# Patient Record
Sex: Male | Born: 1975 | Race: White | Hispanic: No | Smoking: Current every day smoker
Health system: Southern US, Community
[De-identification: ages and names within clinical notes are randomized; demographics above are authoritative.]

## PROBLEM LIST (undated history)

## (undated) HISTORY — PX: HERNIA REPAIR: SHX51

## (undated) NOTE — Discharge Summary (Signed)
 Formatting of this note is different from the original. Images from the original note were not included.   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 Two-dimensional and 3-dimensional reformatted angiographic images were performed. CONTRAST: 100 mL iopamidoL (ISOVUE-370) 76 % injection 100 mL, no contrast reaction reported. FINDINGS: Bandlike opacities at the lung bases likely represent atelectasis.e Patent celiac artery. Redemonstrated intramural hematoma of the celiac artery extending into the proximal splenic artery and common hepatic artery. Small volume of associated hemorrhage not significant changed over the course of the splenic artery. Probable hepatic steatosis. The gallbladder is present. No hepatic, splenic, pancreatic, or adrenal gland lesion identified. 7 mm right renal superior pole cyst. No hydronephrosis. Otherwise unremarkable. The kidneys. Foci of subcutaneous air likely represents an injection site over the right lower quadrant. Visualized portions of the appendix are unremarkable. Unremarkable appearance of the bladder. Mild prostatomegaly. Trace free  fluid within the pelvis is abnormal. No acute process identified within the pelvis within the constraints of exam. No discrete adenopathy. Trace anterior vertebral body wedging is unchanged in the inferior thoracic spine. Moderate calcified and noncalcified plaque but nonaneurysmal abdominal aorta and its associated branches. Patent bilateral common iliac arteries, inferior mesenteric artery, superior mesenteric artery, bilateral renal arteries, and accessory left renal artery. Three-D reconstructed images support the findings above.   IMPRESSION: NO INTERVAL CHANGE IN INTRAMURAL HEMATOMA INVOLVING THE CELIAC TRUNK EXTENDING INTO THE SPLENIC ARTERY AND COMMON HEPATIC ARTERY. SMALL VOLUME OF ILL-DEFINED FAT STRANDING LIKELY REPRESENTS BLOOD PRODUCTS. VERY SMALL VOLUME OF SIMPLE FLUID NOTED IN THE PELVIS WITHOUT ETIOLOGY FOR THIS FINDING. Electronically Signed By: Lonni ONEIDA Rigg, MD 10/25/2022 15:59 Workstation: [id number]  Cat Scan Abdomen and Pelvis with IV Contrast Only  Result Date: 10/24/2022 CT SCAN OF THE ABDOMEN AND PELVIS WITH IV CONTRAST: COMPARISON: None Available at Time of Dictation TECHNIQUE: Utilizing automatic exposure control, helically acquired images were obtained from the lung bases through the symphysis pubis following IV contrast administration. CONTRAST: 100 mL iopamidoL (ISOVUE-370) 76 % injection 100 mL, no reaction. FINDINGS: Ill-defined high attenuation stranding surrounds the celiac trunk extending along the common hepatic and splenic arteries. Suggestion of underlying intramural hematoma. Findings asymmetric left with stranding/fluid extending caudally along the left retromesenteric plane. No artery luminal irregularity. No aneurysm. Mild luminal narrowing. Normal SMA and renal arteries. Hepatic steatosis, small cyst peripherally hepatic segment 4A. Normal gallbladder and biliary tree. Normal pancreas, spleen, adrenal glands, kidneys. Abdominal aorta normal caliber, mild scattered  atherosclerosis. No bowel obstruction or inflammatory changes. No free fluid. Normal bladder and prostate. Mild lumbar spondylosis, no significant canal stenosis. Mild hip osteoarthritis.    IMPRESSION: ACUTE INTRAMURAL HEMATOMA INVOLVING THE CELIAC TRUNK EXTENDING INTO THE SPLENIC AND COMMON  HEPATIC ARTERIES. DIFFERENTIAL CONSIDERATIONS INCLUDE SEGMENTAL ARTERIAL MEDIOLYSIS AND INFLAMMATORY ARTERITIDES.  THERE IS ILL-DEFINED STRANDING SURROUNDING THE INVOLVED ARTERIAL SEGMENTS INDICATIVE OF INFLAMMATORY CHANGES AND/OR BLEEDING. Electronically Signed By: Adine Morales MD 10/24/2022 21:07 Workstation: [id number]  Procedures:   None  PERTINENT LABS:  Recent Labs  Lab 10/28/22 9380 10/27/22 1037 10/27/22 0650 10/26/22 1015 10/26/22 0548 10/25/22 1609 10/25/22 0521 10/24/22 2001  WBC 12.3*   < >  --   --   --   --  12.9* 14.9*  HCT 49.6   < >  --   --  48.4   < > 49.8 51.8*  HGB 16.6   < >  --   --  16.1   < > 16.9 17.8*  PLT 233   < >  --   --   --   --  210 203  NA 141  --  144  --  141  --  143 139  K 4.5  --  3.6  --  4.0  --  4.0 5.1  CL 108*  --  109*  --  110*  --  112* 102  CO2 28.7  --  24.2  --  24.1  --  24.0 25.6  BUN 15  --  14  --  11  --  8* 9  CREATININE 1.00  --  0.89  --  0.91  --  0.86 0.87  CHOL  --   --   --   --  220*  --   --   --   TRIG  --   --   --   --  236*  --   --   --   HDL  --   --   --   --  39.5  --   --   --   LDL  --   --   --   --  133.3  --   --   --   HA1C  --   --   --  5.4  --   --   --   --   ALB  --   --   --   --   --   --  3.9 4.0  TP  --   --   --   --   --   --  7.1 7.9  DBIL  --   --   --   --   --   --  0.2 <0.1  ALT  --   --   --   --   --   --  32 45  AST  --   --   --   --   --   --  19 40*  ALK  --   --   --   --   --   --  105 124*   < > = values in this interval not displayed.    BRIEF ADMISSION HISTORY:  Patient is a 1 y.o. male with no known PMH who presents from Home with abdominal pain. Pt reports having recent cough/upper  resp congestion for the last few weeks. He and partner have had dry cough for >1 week but otherwise the patient has felt well. Yesterday AM he woke up with severe epigastric pain. This was assoc w/ nausea but no vomiting or diarrhea. BMs have been normal. No blood in stool. He was found to be severely hypertensive on arrival to the ER  w/ BP 220/110s. He denies prior h/o HTN but doesn't see a doctor routinely. Despite improvement in BP he continues to have severe epigastric pain.  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 ? 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 Presumed 2/2 hypertensive emergency w/ dissection ? ESR, CRP, hepatitis panel, HIV, ANA, ANCA normal ? Was treated w/ 3 days of solumedrol 1 mg/kg daily until vasculitis was ruled out with negative auto-antibody testing ? BP control as below ? Vascular Surgery was consulted but no surgical intervention was recommended - discussed w/ Dr. Dovie at the time of discharge, will need repeat imaging as outpatient  Hypertensive emergency. ? Coreg and hydralazine were started with good effect ? Short term goal SBP <160 in the setting of dissection  Hyperlipidemia. ? Statin was started  URI 2/2 rhinovirus. ? Supportive care  DISCHARGE EXAM: Vitals:   10/28/22 0536 10/28/22 0606 10/28/22 0800 10/28/22 0854  BP: (!) 141/96  (!) 140/95   BP Location: Left upper arm     Patient Position: Semi- fowlers     Pulse: 53   54  Resp: 16   16  Temp: 97.9 F (36.6 C)   98.1 F (36.7 C)  TempSrc: Oral   Oral  SpO2: 96%   96%  Weight:  216 lb 5 oz (98.1 kg)    Height:       Weight on admission Weight: 165 lb (74.8 kg) Body mass index is 30.17 kg/m. Weight on discharge Weight: 216 lb 5 oz (98.1 kg) Body mass index is 30.17 kg/m. General:  Alert and oriented to person, place, and time. No acute distress. HEENT:  PERRLA. No conjunctival erythema. No  icterus.  Moist mucous membranes. Respiratory: Clear to auscultation bilaterally. Cardiovascular:  Regular rate and rhythm. Abdomen:  Normal bowel sounds. Abdomen soft, epigastric and LUQ tenderness, and non-distended. Extremities:  No cyanosis or edema. Neurologic: Cranial nerves II-XII intact. Sensory and motor grossly intact.  Disposition:        home   DISCHARGE MEDS, DIET, WOUND CARE:   Discharge medications Discharge meds  Current Discharge Medication List   START taking these medications     Dose Details  atorvastatin 40 MG tablet Commonly known as: LIPITOR Signed by: Tinnie Blush, MD   40 mg  Take 1 tablet (40 mg total) by mouth nightly. Quantity: 90 tablet Refills: 0  carvediloL 12.5 MG tablet Commonly known as: COREG Signed by: Tinnie Blush, MD   12.5 mg  Take 1 tablet (12.5 mg total) by mouth 2 (two) times daily with meals. Quantity: 180 tablet Refills: 0  hydrALAZINE 50 MG tablet Commonly known as: APRESOLINE Signed by: Tinnie Blush, MD   50 mg  Take 1 tablet (50 mg total) by mouth every 8 (eight) hours. Quantity: 270 tablet Refills: 0  oxyCODONE  10 mg immediate release tablet Signed by: Tinnie Blush, MD   10 mg  Take 1 tablet (10 mg total) by mouth every 6 (six) hours as needed for Pain. Quantity: 30 tablet Refills: 0    Where to Get Your Medications   These medications were sent to [facility] - [place], [place] - [address]  [address] [place], [place] [address]   Hours: M-F: 8:00AM-7:00PM; Sat-Sun: 9:00AM-5:00PM Phone: [phone]   atorvastatin 40 MG tablet  carvediloL 12.5 MG tablet  hydrALAZINE 50 MG tablet  oxyCODONE  10 mg immediate release tablet   Diet Continue  inpatient diet:  Diet and Nourishment Orders     Start       10/27/22 9178  Diet  Diet effective now       Question:  Diet  Answer:  Regular       Wound Wound:  Wound Orders (From admission, onward)   None    DISCHARGE PLAN OF  CARE: 1. Follow up: PCP: referred to [facility] Referred to [facility] Transitions of Care Provider: N/A Specialist: Vascular Surgery in 2-4 weeks Scheduled Appointments: No future appointments.   2. Labs needed after discharge: None  3. Pending labs at the time of discharge: None  4. Clinical issues to be addressed at follow-up: - Needs to establish w/ PCP for long term BP management  - F/u with Vascular Surgery for repeat imaging   45 minutes spent on discharge planning and medication reconciliation (>50% of time spent on counseling).  Tinnie Blush, MD 10/28/2022 13:05 [facility]  Electronically signed by Tinnie Blush, MD at 10/28/2022  1:42 PM EDT

## (undated) NOTE — Progress Notes (Signed)
 Formatting of this note is different from the original. Vascular Surgery Daily Progress Note HD:  LOS: 1 day  Subjective: Reports very slight improvement in LUQ pain from yesterday. No longer feeling nauseated, denies emesis since arrival to the hospital yesterday. BP has been at goal of < 160  Objective: Temp (24hrs), Avg:97.6 F (36.4 C), Min:97.6 F (36.4 C), Max:97.7 F (36.5 C)  Temp:  [97.6 F (36.4 C)-97.7 F (36.5 C)] 97.7 F (36.5 C) Heart Rate:  [47-94] 63 Resp:  [14-16] 16 BP: (106-226)/(67-126) 143/90 SpO2:  [91 %-98 %] 97 %   Intake/Output: No intake or output data in the 24 hours ending 10/26/22 1003  UOP: NR  Physical Exam: BP (!) 143/90 (Patient Position: Semi- fowlers)   Pulse 63   Temp 97.7 F (36.5 C) (Oral)   Resp 16   Ht 5' 11 (1.803 m)   Wt 165 lb (74.8 kg)   SpO2 97%   BMI 23.01 kg/m  Gen: no acute distress Neuro: A&O x 3 Chest/Cards: regular rate Pulm: normal effort Abd: soft, moderate LUQ tenderness w/o rebound or peritonitis, remaining abdomen non-tender, nondistended Exts: warm, well perfused Pulses: b/l palpable radial/femoral/DP pulses  Labs/Studies: Recent Labs  Lab 10/26/22 0548 10/25/22 1609 10/25/22 0521 10/24/22 2001  WBC  --   --  12.9* 14.9*  HGB 16.1 17.0 16.9 17.8*  HCT 48.4 49.5 49.8 51.8*  PLT  --   --  210 203   Recent Labs  Lab 10/26/22 0548 10/25/22 0521 10/24/22 2001  NA 141 143 139  K 4.0 4.0 5.1  CL 110* 112* 102  CO2 24.1 24.0 25.6  BUN 11 8* 9  CREATININE 0.91 0.86 0.87  MG  --  1.9  --    Recent Labs  Lab 10/25/22 0521 10/24/22 2001  TBIL 0.7 0.8  DBIL 0.2 <0.1  AST 19 40*  ALT 32 45  ALK 105 124*  ALB 3.9 4.0  TP 7.1 7.9   Micro:  Influenza/Covid/RSV: neg Resp Panel (4/16): rhinovirus/enterovirus  Imaging:  CT ANGIOGRAM ABDOMEN PELVIS W WO CONTRAST Narrative: CT ANGIOGRAPHY OF THE ABDOMEN AND PELVIS  Reason for exam: f/u scan for intramural hematoma of celiac artery extending  into splenic and hepatic identifed on CTAP at OSH  COMPARISON: 10/24/2022  TECHNIQUE: Utilizing automatic exposure control, helically acquired images were obtained from the lung bases through the symphysis pubis with and without IV contrast administration.  Two-dimensional and 3-dimensional reformatted angiographic images were performed.  CONTRAST: 100 mL iopamidoL (ISOVUE-370) 76 % injection 100 mL, no contrast reaction reported.   FINDINGS: Bandlike opacities at the lung bases likely represent atelectasis.e  Patent celiac artery. Redemonstrated intramural hematoma of the celiac artery extending into the proximal splenic artery and common hepatic artery. Small volume of associated hemorrhage not significant changed over the course of the splenic artery. Probable hepatic steatosis. The gallbladder is present. No hepatic, splenic, pancreatic, or adrenal gland lesion identified. 7 mm right renal superior pole cyst. No hydronephrosis. Otherwise unremarkable. The kidneys. Foci of subcutaneous air likely represents an injection site over the right lower quadrant. Visualized portions of the appendix are unremarkable. Unremarkable appearance of the bladder. Mild prostatomegaly. Trace free fluid within the pelvis is abnormal. No acute process identified within the pelvis within the constraints of exam. No discrete adenopathy. Trace anterior vertebral body wedging is unchanged in the inferior thoracic spine.  Moderate calcified and noncalcified plaque but nonaneurysmal abdominal aorta and its associated branches. Patent bilateral common iliac arteries,  inferior mesenteric artery, superior mesenteric artery, bilateral renal arteries, and accessory left renal artery.  Three-D reconstructed images support the findings above. Impression: IMPRESSION:  NO INTERVAL CHANGE IN INTRAMURAL HEMATOMA INVOLVING THE CELIAC TRUNK EXTENDING INTO THE SPLENIC ARTERY AND COMMON HEPATIC ARTERY. SMALL  VOLUME OF ILL-DEFINED FAT STRANDING LIKELY REPRESENTS BLOOD PRODUCTS.  VERY SMALL VOLUME OF SIMPLE FLUID NOTED IN THE PELVIS WITHOUT ETIOLOGY FOR THIS FINDING.  Electronically Signed By: Lonni ONEIDA Rigg, MD 10/25/2022 15:59 Workstation: [id number]  Assessment: Leonard Gilbert is a 61 y.o. Caucasian male with pmhx significant only for anxiety and GERD who presented to [facility] with acute LUQ pain, CT scan revealed an intramural hematoma of the celiac trunk extending into the splenic and common hepatic arteries so he was transferred to our facility for further work-up/management, Vascular Surgery has been consulted in order to evaluate if surgical intervention is indicated. Patient denies history of autoimmune disorder or vasculitis.  Repeat CTA scan reviewed, hard to decipher if the stranding noted around the arterial segments is inflammatory changes or blood, however, imaging findings are stable from the initial CT scan that was completed >12 hrs prior, patient remains hemodynamically stable w/ a benign abdomen, and HgB is stable.  Plan: No surgical intervention at this time, CTM abdominal exam AM HgB ordered for 4/18 Goal SBP < 160 ANA/HIV/Hepatitis panel is negative, CRP very slightly elevated at 3.0, ANCA pending. Decision regarding steroids per primary team Diet: clears, ADAT to regular. mIVF d/c'd  Kate Earnie Mash, MD PGY-4 10/26/2022 10:03 Contact through [employer]  ATTENDING ATTESTATION  I saw and evaluated the patient, performing the key elements of the service. I discussed the findings, assessment and plan with the resident, and agree with the resident's exam findings and proposed management and treatment plans. My countersignature will demonstrate my concurrence with that documentation. Pt seen 10-26-22 Abdominal and back pain improved Abdomen soft - no significant tenderness Imaging c/w dissection celiac artery and branches Pt with poorly controlled HTN upon admission No  vascular surgical / interventional emergency at present Recommend medical management with pulse pressure control Plan repeat imaging pending Pt and family updated and consent/agreee  Alm Rife, MD [facility]    Electronically signed by Kate Earnie Mash, MD at 10/26/2022 11:55 AM EDT Electronically signed by Kate Earnie Mash, MD at 10/26/2022 11:55 AM EDT Electronically signed by Kate Earnie Mash, MD at 10/26/2022 11:57 AM EDT Electronically signed by Alm DELENA Rife, MD at 10/28/2022  8:46 AM EDT Electronically signed by Alm DELENA Rife, MD at 10/28/2022  8:46 AM EDT

## (undated) NOTE — Progress Notes (Signed)
 Formatting of this note is different from the original. Vascular Surgery Daily Progress Note HD:  LOS: 3 days  Subjective: Says that he ate a little bit of regular food yesterday, had some abdominal discomfort afterwards but overall says his abdominal pain continues to improve slowly each day. BP has still been intermittently > 160 despite primary team starting a 2nd scheduled anti-HTN yesterday  Objective: Temp (24hrs), Avg:98 F (36.7 C), Min:97.9 F (36.6 C), Max:98.1 F (36.7 C)  Temp:  [97.9 F (36.6 C)-98.1 F (36.7 C)] 98.1 F (36.7 C) Heart Rate:  [52-83] 54 Resp:  [16] 16 BP: (140-192)/(92-108) 140/95 SpO2:  [96 %] 96 %   Intake/Output:  Intake/Output Summary (Last 24 hours) at 10/28/2022 1111 Last data filed at 10/28/2022 0854 Gross per 24 hour  Intake 120 ml  Output --  Net 120 ml   UOP: NR  Physical Exam: BP (!) 140/95   Pulse 54   Temp 98.1 F (36.7 C) (Oral)   Resp 16   Ht 5' 11 (1.803 m)   Wt 216 lb 5 oz (98.1 kg)   SpO2 96%   BMI 30.17 kg/m  Gen: no acute distress Neuro: A&O x 3 Chest/Cards: regular rate Pulm: normal effort Abd: soft, mild LUQ tenderness w/o rebound or peritonitis (improved from yesterday), remaining abdomen non-tender, nondistended Exts: warm, well perfused Pulses: b/l palpable radial/femoral/DP pulses  Labs/Studies: Recent Labs  Lab 10/28/22 0619 10/27/22 1037 10/26/22 0548 10/25/22 1609 10/25/22 0521  WBC 12.3* 13.5*  --   --  12.9*  HGB 16.6 16.2 16.1   < > 16.9  HCT 49.6 47.4 48.4   < > 49.8  PLT 233 212  --   --  210   < > = values in this interval not displayed.   Recent Labs  Lab 10/28/22 0619 10/27/22 0650 10/26/22 0548 10/25/22 0521  NA 141 144 141 143  K 4.5 3.6 4.0 4.0  CL 108* 109* 110* 112*  CO2 28.7 24.2 24.1 24.0  BUN 15 14 11  8*  CREATININE 1.00 0.89 0.91 0.86  MG  --   --   --  1.9   Recent Labs  Lab 10/25/22 0521 10/24/22 2001  TBIL 0.7 0.8  DBIL 0.2 <0.1  AST 19 40*  ALT 32 45   ALK 105 124*  ALB 3.9 4.0  TP 7.1 7.9   Micro:  Influenza/Covid/RSV: neg Resp Panel (4/16): rhinovirus/enterovirus  Imaging:  No new  Assessment: Leonard Gilbert is a 29 y.o. Caucasian male with pmhx significant only for anxiety and GERD who presented to [facility] with acute LUQ pain, CT scan revealed a dissection of the celiac trunk extending into the splenic and common hepatic arteries so he was transferred to our facility for further work-up/management, Vascular Surgery has been consulted in order to evaluate if surgical intervention is indicated. Patient denies history of autoimmune disorder or vasculitis.  Repeat CTA scan reviewed, hard to decipher if the stranding noted around the arterial segments is inflammatory changes or blood, however, imaging findings are stable from the initial CT scan that was completed >12 hrs prior, patient remains hemodynamically stable w/ a benign abdomen, and HgB is stable.  Plan: STRICT BP CONTROL w/ goal SBP < 160, patient will need to be on an adequate PO regimen to achieve this goal prior to discharge home. Was started on Coreg 12.5 TID on 4/16, primary team increased the dosage of scheduled hydralazine today. Recommend adding a beta-blocker if BP is still  uncontrolled on this regimen HgB remains stable, no need to further trend ANA/ANCA/HIV/Hepatitis panel is negative, ESR/CRP normal. Do not believe that patient need steroids, will defer final decision to primary Regular diet as tolerated No surgical intervention indicated at this time  Kate Earnie Mash, MD PGY-4 10/28/2022 11:11 Contact through [facility]  ATTENDING ATTESTATION  I saw and evaluated the patient, performing the key elements of the service. I discussed the findings, assessment and plan with the resident, and agree with the resident's exam findings and proposed management and treatment plans. My countersignature will demonstrate my concurrence with that documentation. 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 - no change of f/u imaging Pt with poorly controlled HTN upon admission - being managed by Medical team No vascular surgical / interventional emergency at present Recommend medical management with pulse pressure control - as ongoing Pt and family updated and consent/agree Will see in f/u after d/c  Alm Rife, MD [facility]    Electronically signed by Kate Earnie Mash, MD at 10/28/2022 11:18 AM EDT Electronically signed by Alm DELENA Rife, MD at 10/29/2022 10:02 AM EDT Electronically signed by Alm DELENA Rife, MD at 10/29/2022 10:02 AM EDT

## (undated) NOTE — Progress Notes (Signed)
 Formatting of this note is different from the original. Vascular Surgery Daily Progress Note HD:  LOS: 2 days  Subjective: Tolerating clear liquids w/o nausea or emesis, feels like trying something more substantial to eat this morning. Reports further improvement in LUQ abdominal pain from yesterday. BP has been intermittently elevated >160 systolic over the past 24 hrs, was as high as 198 systolic at midnight check.  Objective: Temp (24hrs), Avg:98 F (36.7 C), Min:97.9 F (36.6 C), Max:98.1 F (36.7 C)  Temp:  [97.9 F (36.6 C)-98.1 F (36.7 C)] 98 F (36.7 C) Heart Rate:  [55-75] 75 Resp:  [16] 16 BP: (120-198)/(86-111) 155/101 SpO2:  [94 %-97 %] 94 %   Intake/Output: No intake or output data in the 24 hours ending 10/27/22 0840  UOP: NR  Physical Exam: BP (!) 155/101   Pulse 75   Temp 98 F (36.7 C) (Oral)   Resp 16   Ht 5' 11 (1.803 m)   Wt 165 lb (74.8 kg)   SpO2 94%   BMI 23.01 kg/m  Gen: no acute distress Neuro: A&O x 3 Chest/Cards: regular rate Pulm: normal effort Abd: soft, mild LUQ tenderness w/o rebound or peritonitis, remaining abdomen non-tender, nondistended Exts: warm, well perfused Pulses: b/l palpable radial/femoral/DP pulses  Labs/Studies: Recent Labs  Lab 10/26/22 0548 10/25/22 1609 10/25/22 0521 10/24/22 2001  WBC  --   --  12.9* 14.9*  HGB 16.1 17.0 16.9 17.8*  HCT 48.4 49.5 49.8 51.8*  PLT  --   --  210 203   Recent Labs  Lab 10/27/22 0650 10/26/22 0548 10/25/22 0521  NA 144 141 143  K 3.6 4.0 4.0  CL 109* 110* 112*  CO2 24.2 24.1 24.0  BUN 14 11 8*  CREATININE 0.89 0.91 0.86  MG  --   --  1.9   Recent Labs  Lab 10/25/22 0521 10/24/22 2001  TBIL 0.7 0.8  DBIL 0.2 <0.1  AST 19 40*  ALT 32 45  ALK 105 124*  ALB 3.9 4.0  TP 7.1 7.9   Micro:  Influenza/Covid/RSV: neg Resp Panel (4/16): rhinovirus/enterovirus  Imaging:  No new  Assessment: Leonard Gilbert is a 17 y.o. Caucasian male with pmhx significant only  for anxiety and GERD who presented to [facility] with acute LUQ pain, CT scan revealed an intramural hematoma of the celiac trunk extending into the splenic and common hepatic arteries so he was transferred to our facility for further work-up/management, Vascular Surgery has been consulted in order to evaluate if surgical intervention is indicated. Patient denies history of autoimmune disorder or vasculitis.  Repeat CTA scan reviewed, hard to decipher if the stranding noted around the arterial segments is inflammatory changes or blood, however, imaging findings are stable from the initial CT scan that was completed >12 hrs prior, patient remains hemodynamically stable w/ a benign abdomen, and HgB is stable.  Plan: STRICT BP CONTROL w/ goal SBP < 160, patient will need to be on an adequate PO regimen to achieve this goal prior to discharge home. Was started on Coreg 12.5 TID on 4/16, primary team added PO hydralazine today F/u AM HgB (pending ANA/HIV/Hepatitis panel is negative, ESR normal, CRP trending down, ANCA pending. Decision regarding steroids per primary team, currently on Solumedrol Diet advanced to regular No surgical intervention indicated at this time  Kate Earnie Mash, MD PGY-4 10/27/2022 08:40 Contact through [facility]  ATTENDING ATTESTATION  I saw and evaluated the patient, performing the key elements of the service. I  discussed the findings, assessment and plan with the resident, and agree with the resident's exam findings and proposed management and treatment plans. My countersignature will demonstrate my concurrence with that documentation. 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

## (undated) NOTE — Unmapped External Note (Signed)
 Formatting of this note is different from the original. Images from the original note were not included.  [facility] HOSPITALIST ACCEPTANCE NOTE  Facility/Office requesting transfer/admission:   [facility]  Date/Time of accept: 10/24/2022 22:51  Transferring provider:   Wanda Servant, NP  PCP:  No primary care provider on file.   Reported presenting complaints: Chief Complaint  Patient presents with  ? Abdominal Pain    Pt reports LUQ pain that started at 0700 this morning, + nausea and diarrhea. Fever of 101 this morning.    Reported physical exam findings:  BP 196/134 (Patient Position: Sitting)   Pulse 59   Temp 98.1 F (36.7 C) (Oral)   Resp 15   Ht 5' 11 (1.803 m)   Wt 165 lb (74.8 kg)   SpO2 96%   BMI 23.01 kg/m   Notable radiology or laboratory findings:   H/H 17.8 / 51.8  WBC 14.9  Treatment given prior to arrival to [facility]:   See Below   Diagnostics / Imaging: Cat Scan Abdomen and Pelvis with IV Contrast Only  Result Date: 10/24/2022 CT SCAN OF THE ABDOMEN AND PELVIS WITH IV CONTRAST: COMPARISON: None Available at Time of Dictation TECHNIQUE: Utilizing automatic exposure control, helically acquired images were obtained from the lung bases through the symphysis pubis following IV contrast administration. CONTRAST: 100 mL iopamidoL (ISOVUE-370) 76 % injection 100 mL, no reaction. FINDINGS: Ill-defined high attenuation stranding surrounds the celiac trunk extending along the common hepatic and splenic arteries. Suggestion of underlying intramural hematoma. Findings asymmetric left with stranding/fluid extending caudally along the left retromesenteric plane. No artery luminal irregularity. No aneurysm. Mild luminal narrowing. Normal SMA and renal arteries. Hepatic steatosis, small cyst peripherally hepatic segment 4A. Normal gallbladder and biliary tree. Normal pancreas, spleen, adrenal glands, kidneys. Abdominal aorta normal caliber, mild scattered atherosclerosis. No bowel  obstruction or inflammatory changes. No free fluid. Normal bladder and prostate. Mild lumbar spondylosis, no significant canal stenosis. Mild hip osteoarthritis.    IMPRESSION:  - ACUTE INTRAMURAL HEMATOMA INVOLVING THE CELIAC TRUNK EXTENDING INTO THE SPLENIC AND COMMON HEPATIC ARTERIES.  - DIFFERENTIAL CONSIDERATIONS INCLUDE SEGMENTAL ARTERIAL MEDIOLYSIS AND INFLAMMATORY ARTERITIDES.  -  THERE IS ILL-DEFINED STRANDING SURROUNDING THE INVOLVED ARTERIAL SEGMENTS INDICATIVE OF INFLAMMATORY CHANGES AND/OR BLEEDING.  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 Hypertensive emergency ? Transfer for vascular, GI consults ? Presenting symptom was LUQ pain ? Hypertensive emergency on arrival  Date/Time Temp Pulse Resp BP ABP Arterial Line BP  SpO2 Weight Who  10/24/22 2240 -- -- -- 196/134 -- -- -- MA  10/24/22 2204 -- 59 15 181/120 -- 96 % -- MA  10/24/22 2132 -- 84 16 226/130 -- 98 % -- CB  10/24/22 1908 98.1 F (36.7 C) 60 16 221/120 -- 97 % 165 lb (74.8 kg)    Anticipated needs once transfer complete: ? Imaging: MRI Abdomen ? ? Procedure: Per GI / Vascualr ? Speciality Consultation: GI, Vascular  Plan:  Level of bed request: Floor  Acuity Level: ? 3  Leonard Ozell Peach, DO  10/24/2022 22:51 [employer]  (Nocturnist)  Electronically signed by Leonard CHRISTELLA Peach, DO at 10/24/2022 10:55 PM EDT

## (undated) NOTE — Unmapped External Note (Signed)
 Formatting of this note might be different from the original. Vascular Surgery Plan of Care:  CTA images reviewed, no urgent or emergent surgical intervention indicated. CTM abdominal exam and continue to trend HgB q8hr. SBP has been at goal of < 160  Kate Earnie Mash, MD PGY-4 10/25/2022 18:39 Contact through [facility]  ATTENDING ATTESTATION  I saw and evaluated the patient, performing the key elements of the service. I discussed the findings, assessment and plan with the resident, and agree with the resident's exam findings and proposed management and treatment plans. My countersignature will demonstrate my concurrence with that documentation.  Alm Rife, MD [facility]    Electronically signed by Kate Earnie Mash, MD at 10/25/2022  6:39 PM EDT Electronically signed by Alm DELENA Rife, MD at 10/28/2022  8:44 AM EDT Electronically signed by Alm DELENA Rife, MD at 10/28/2022  8:44 AM EDT

## (undated) NOTE — Unmapped External Note (Signed)
 Formatting of this note might be different from the original.  Problem: Oral Intake Aspiration - Risk of Goal: Absence of aspiration Outcome: Progressing  Problem: Tobacco Use Goal: Inpatient tobacco use cessation counseling participation Description: REMINDER(s): If patient refuses counseling, please document the refusal. Outcome: Progressing  Problem: Droplet Isolation Precautions Goal: Absence of active infection Outcome: Progressing  Problem: NPO/Enteral Aspiration - Risk of Goal: Absence of aspiration Outcome: Completed Goal: Patent airway Outcome: Completed  Electronically signed by Veria Earnie Penner, RN at 10/28/2022 11:06 AM EDT

## (undated) NOTE — Progress Notes (Signed)
 Formatting of this note is different from the original. Images from the original note were not included.  [facility] Hospitalist Progress Note Date of Service: 10/26/2022  Patient Name:  Leonard Gilbert    DOB:  04-01-1976    MEDICAL RECORD [id number] Date of Admission:  10/24/2022    PCP: No primary care provider on file.  Subjective:  Pain better w/ morphine but worried about what will happen when pain meds wear off. Appetite still poor, has only tried a couple of sips of liquids. No vomiting or diarrhea.  Family Communication  I discussed the patients clinical course and treatment plan with the patient who was awake and alert, the patient's family/designee at the bedside  Physical Exam: BP (!) 155/94 (Patient Position: Semi- fowlers)   Pulse 50   Temp 97.6 F (36.4 C) (Oral)   Resp 14   Ht 5' 11 (1.803 m)   Wt 165 lb (74.8 kg)   SpO2 98%   BMI 23.01 kg/m  Temp (24hrs), Avg:97.6 F (36.4 C), Min:97.6 F (36.4 C), Max:97.6 F (36.4 C)  Weight on admission Weight: 165 lb (74.8 kg) Weight today Weight: 165 lb (74.8 kg)  General:  Alert and oriented to person, place, and time. No acute distress. HEENT:  PERRLA. No conjunctival erythema. No icterus.  Moist mucous membranes. Respiratory: Clear to auscultation bilaterally. Cardiovascular:  Regular rate and rhythm. Abdomen:  Normal bowel sounds. Abdomen soft, epigastric and LUQ tenderness, and non-distended. Extremities:  No cyanosis or edema. Neurologic: Cranial nerves II-XII intact. Sensory and motor grossly intact.  Current Medications: ? carvediloL  12.5 mg Oral BID With Food  ? [Held by provider] enoxaparin (LOVENOX) injection  40 mg Subcutaneous Daily  ? methylPREDNISolone  (SOLU-MEDROL ) IV  1 mg/kg Intravenous Q24H  ? nicotine  1 patch Transdermal Daily  ? pantoprazole (PROTONIX) IV  40 mg Intravenous Q12H   Labs:   Recent Labs  Lab 10/24/22 2001 10/25/22 0521  NA 139 143  K 5.1 4.0  CL 102 112*  CO2 25.6 24.0  BUN 9 8*   CREATININE 0.87 0.86  GLUC 114* 120*   Recent Labs  Lab 10/24/22 2001 10/25/22 0521 10/25/22 1609  WBC 14.9* 12.9*  --   HGB 17.8* 16.9 17.0  HCT 51.8* 49.8 49.5  PLT 203 210  --    Recent Labs  Lab 10/24/22 2001 10/25/22 0521  TBIL 0.8 0.7  DBIL <0.1 0.2  AST 40* 19  ALT 45 32  ALK 124* 105  ALB 4.0 3.9  TP 7.9 7.1   ASSESSMENT & PLAN  Abdominal pain 2/2 intramural hematoma of celiac artery. ? 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 Possibly secondary to vasculitis vs hypertensive emergency w/ dissection ? ESR, CRP, hepatitis panel, HIV, ANA normal ? ANCA pending ? Continue coreg, goal SBP <140 ? Continue solumedrol 1 mg/kg daily (at least until vasculitis is ruled out) ? Appreciate input of Vascular Surgery   Hypertensive emergency. ? Continue coreg, and PRN labetalol or hydralazine   URI 2/2 rhinovirus. ? Supportive care  Goals for today: IV steroids, BP control, await ANCA Disposition: Home Expected date of discharge: 2-3 days  DVT Prophylaxis: SCD's Fluids: normal saline Nutrition: Other clear liquid Code Status: full code  MDM: PROBLEM COMPLEXITY - celiac artery dissection - high DATA COMPLEXITY - (moderate 1/3, high 2/3) Category 1 (3 of 4) ? Reviewed results of tests/studies CBC BMP, HIV, hepatitis, ANA Category 2 ? None Category  3 ? None RISKS - High risk of organ failure/deterioration or death despite max therapy - high  INPATIENT ROUNDING LEVEL 3 - [id number]  Tinnie Blush, MD  10/26/2022 05:51 [facility] [place]  Electronically signed by Tinnie Blush, MD at 10/26/2022  4:23 PM EDT

## (undated) NOTE — Progress Notes (Signed)
 Formatting of this note might be different from the original. Patient discharge home with significant other. All IV's removed. Discharged instructions reviewed with patient at bedside and he verbalized understanding. Patient taken to outpatient pharmacy upon discharge.  Electronically signed by Tawni Lucie Chow, RN at 10/28/2022  3:01 PM EDT

## (undated) NOTE — Progress Notes (Signed)
 Formatting of this note is different from the original.   10/25/22 0938  Discharge Assessment  Discharge Screening No identified indicators  Case Management Face to Face Assessment (FOR CASE MANAGEMENT STAFF ONLY) Recommended  Support Systems Spouse/significant other  Lives With Significant other  Type of Residence Private residence  Home Layout One level  Home Access Stairs to enter with rails  Baseline Walking Independent  Baseline Wheelchair mobility Not applicable  Baseline Cognition Intact  Baseline Mode of Retail Banker None  Home Bathroom Equipment None  Services Prior to Admission None  Patient Discharge Goal/Expectation Home-patient/family capable  Case Management Face to Face Assessment Status (FOR CASE MANAGEMENT STAFF ONLY) Complete  Case Management Financial Assessment Uninsured  Pharmacy Transitions of Care - Charted by SW/CM only  Fill Medications on Discharge at Outpatient Pharmacy? Yes, pending discharge medication orders  Does Patient Have Medication Access Issues? Yes,unsure or no prescription insurance  To Do List For DC Tech  Routine  To Do List For Social Work Medication Assistance  Complete  Social Work Completed Med Programmer, Mult[employer] 1  To Do List For Outpatient Pharmacy Routine  Transition Role SW/CM   Case Management has assessed this patient/family or caregiver's readiness, willingness and ability to provide or support self-management activities when needed after discharge from the acute care setting.  CM met with patient at bedside.  He is admitted with vasculitis.  He is independent and drives.  He does not have a PCP or insurance.  He does not take any medications.  He works about 30 hours a week.  CM entered for pharmacy to assist with medications.  CM will place [facility] on AVS for outpatient follow up.  No anticipated discharge needs at this time.  CM will continue to follow.  Randall Boom RN Case Manager  [phone]  Electronically signed by Randall Massie Boom, Case Manager at 10/25/2022  9:44 AM EDT

## (undated) NOTE — Progress Notes (Signed)
 Formatting of this note is different from the original. Images from the original note were not included.  [facility] Hospitalist Progress Note Date of Service: 10/27/2022  Patient Name:  Leonard Gilbert    DOB:  [birth year]    MEDICAL RECORD [id number] Date of Admission:  10/24/2022    PCP: No primary care provider on file.  Subjective:  Pain overall better but still requiring intermittent morphine. BP very high (190/110s) during pain episodes. Still hasn't been able to eat any solid food.  Family Communication  I discussed the patients clinical course and treatment plan with the patient who was awake and alert, the patient's family/designee at the bedside  Physical Exam: BP (!) 120/90 (Patient Position: Semi- fowlers)   Pulse 74   Temp 98 F (36.7 C) (Oral)   Resp 16   Ht 5' 11 (1.803 m)   Wt 165 lb (74.8 kg)   SpO2 94%   BMI 23.01 kg/m  Temp (24hrs), Avg:97.9 F (36.6 C), Min:97.7 F (36.5 C), Max:98.1 F (36.7 C)  Weight on admission Weight: 165 lb (74.8 kg) Weight today Weight: 165 lb (74.8 kg)  General:  Alert and oriented to person, place, and time. No acute distress. HEENT:  PERRLA. No conjunctival erythema. No icterus.  Moist mucous membranes. Respiratory: Clear to auscultation bilaterally. Cardiovascular:  Regular rate and rhythm. Abdomen:  Normal bowel sounds. Abdomen soft, epigastric and LUQ tenderness, and non-distended. Extremities:  No cyanosis or edema. Neurologic: Cranial nerves II-XII intact. Sensory and motor grossly intact.  Current Medications: ? atorvastatin  40 mg Oral Nightly  ? carvediloL  12.5 mg Oral BID With Food  ? [Held by provider] enoxaparin (LOVENOX) injection  40 mg Subcutaneous Daily  ? hydrALAZINE  25 mg Oral 3 times per day  ? methylPREDNISolone  (SOLU-MEDROL ) IV  1 mg/kg Intravenous Q24H  ? nicotine  1 patch Transdermal Daily  ? pantoprazole (PROTONIX) IV  40 mg Intravenous Q12H   Labs:   Recent Labs  Lab 10/26/22 0548  NA 141  K 4.0   CL 110*  CO2 24.1  BUN 11  CREATININE 0.91  GLUC 96   Recent Labs  Lab 10/25/22 1609 10/26/22 0548  HGB 17.0 16.1  HCT 49.5 48.4   No results for input(s): TBIL, DBIL, AST, ALT, ALK, ALB, TP in the last 48 hours.  ASSESSMENT & PLAN  Abdominal pain 2/2 intramural hematoma of celiac artery. ? 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 Possibly secondary to vasculitis vs hypertensive emergency w/ dissection ? ESR, CRP, hepatitis panel, HIV, ANA normal ? ANCA pending ? Continue solumedrol 1 mg/kg daily (at least until vasculitis is ruled out) ? BP control as below ? Taper IV morphine as tolerated ? Appreciate input of Vascular Surgery   Hypertensive emergency.  Continue coreg, add hydralazine, goal SBP <160 ? Continue PRN labetalol or hydralazine   Hyperlipidemia.  Start statin  URI 2/2 rhinovirus. ? Supportive care  Goals for today: IV steroids, adjust BP meds, await ANCA Disposition: Home Expected date of discharge: 1-3 days  DVT Prophylaxis: SCD's Fluids: none Nutrition: Other clear liquid Code Status: full code  MDM: PROBLEM COMPLEXITY - celiac artery dissection - high DATA COMPLEXITY - (moderate 1/3, high 2/3) Category 1 (3 of 4) ? Reviewed results of tests/studies BMP, CRP Category 2 ? None Category 3 ? None RISKS - IV controlled substance use (IV morphine) - high  INPATIENT ROUNDING LEVEL 3 - [id number]  Tinnie Blush,  MD  10/27/2022 06:11 [facility] Electronically signed by Tinnie Blush, MD at 10/27/2022  1:40 PM EDT

## (undated) NOTE — ED Provider Notes (Signed)
 Formatting of this note is different from the original.  EMERGENCY DEPARTMENT ENCOUNTER    CHIEF COMPLAINT   Chief Complaint  Patient presents with  ? Abdominal Pain    Pt reports LUQ pain that started at 0700 this morning, + nausea and diarrhea. Fever of 101 this morning.    HPI   Leonard Gilbert is a 17 y.o. male who presents with left upper abdominal pain that started this morning is progressively worsened throughout the day.  Denies fevers or chills.  He did have nausea and diarrhea earlier today.  Denies urinary symptoms.  No history of same.  Denies injury or trauma.  He is not on blood thinners.  No history of Crohn's or autoimmune disorder.  External Prior Records Reviewed:  None  Additional History Obtained:  None  PAST MEDICAL HISTORY   Past Medical History:  Diagnosis Date  ? Anxiety   ? GERD (gastroesophageal reflux disease)    SURGICAL HISTORY   History reviewed. No pertinent surgical history.  CURRENT MEDICATIONS   No current facility-administered medications for this encounter.   No current outpatient medications on file.   ALLERGIES   No Known Allergies  FAMILY HISTORY   History reviewed. No pertinent family history.  SOCIAL HISTORY   Social History   Socioeconomic History  ? Marital status: Single    Spouse name: None  ? Number of children: None  ? Years of education: None  ? Highest education level: None  Occupational History  ? None  Tobacco Use  ? Smoking status: Every Day    Packs/day: 1.00    Years: 30.00    Additional pack years: 0.00    Total pack years: 30.00    Types: Cigarettes  ? Smokeless tobacco: Never  Vaping Use  ? Vaping Use: Never used  Substance and Sexual Activity  ? Alcohol use: Yes    Comment: 1 whiskey glass per day  ? Drug use: Not Currently  ? Sexual activity: None  Other Topics Concern  ? None  Social History Narrative  ? None   Social Determinants of Health   Financial Resource Strain: Not on file  Food  Insecurity: Not on file  Transportation Needs: Not on file  Social Connections: Not on file  Housing Stability: Not on file   REVIEW OF SYSTEMS   See HPI for further details.  All other systems reviewed and otherwise negative.  PHYSICAL EXAM   VITAL SIGNS: BP 221/120   Pulse 60   Temp 98.1 F (36.7 C) (Oral)   Resp 16   Ht 5' 11 (1.803 m)   Wt 165 lb (74.8 kg)   SpO2 97%   BMI 23.01 kg/m  Constitutional:  Well developed and well nourished.  No acute distress.  Non-toxic in appearance.   Eyes:  PERRL. EOMI.  Conjunctiva normal.  No discharge. HENT:  Atraumatic.  Normocephalic.  Ears normal bilaterally.  Nares clear bilaterally.   Neck:  Normal inspection.  Normal range of motion.  Supple.  No meningeal signs.  No lymphadenopathy. Respiratory:  Normal breath sounds.  No respiratory distress.  No wheezing or rhonchi.  No chest tenderness.  Cardiovascular:  Normal heart rate and rhythm.  Equal pulses. Abdomen:  Soft with tenderness left upper abdomen.  Extremities:  Intact distal pulses.  No edema.  No tenderness.  No cyanosis.  Normal range of motion in all major joints. No tenderness to palpation.  No major deformities noted.  Back: No midline or CVA tenderness.  Skin:  Warm and dry.  No erythema.  No rash.  Neurologic:  Awake and alert.  GCS 15.  RADIOLOGY Cat Scan Abdomen and Pelvis with IV Contrast Only  Final Result  CT SCAN OF THE ABDOMEN AND PELVIS WITH IV CONTRAST:   COMPARISON: None Available at Time of Dictation   TECHNIQUE: Utilizing automatic exposure control, helically  acquired images were obtained from the lung bases through the  symphysis pubis following IV contrast administration.   CONTRAST: 100 mL iopamidoL (ISOVUE-370) 76 % injection 100 mL, no  reaction.    FINDINGS:   Ill-defined high attenuation stranding surrounds the celiac trunk  extending along the common hepatic and splenic arteries.  Suggestion of underlying intramural hematoma. Findings  asymmetric  left with stranding/fluid extending caudally along the left  retromesenteric plane. No artery luminal irregularity. No  aneurysm. Mild luminal narrowing. Normal SMA and renal arteries.   Hepatic steatosis, small cyst peripherally hepatic segment 4A.  Normal gallbladder and biliary tree. Normal pancreas, spleen,  adrenal glands, kidneys.   Abdominal aorta normal caliber, mild scattered atherosclerosis.   No bowel obstruction or inflammatory changes. No free fluid.   Normal bladder and prostate.   Mild lumbar spondylosis, no significant canal stenosis. Mild hip  osteoarthritis.     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 DIFFERENTIAL  CONSIDERATIONS INCLUDE SEGMENTAL ARTERIAL MEDIOLYSIS AND  INFLAMMATORY ARTERITIDES.     THERE IS ILL-DEFINED STRANDING SURROUNDING THE INVOLVED ARTERIAL  SEGMENTS INDICATIVE OF INFLAMMATORY CHANGES AND/OR BLEEDING.   Electronically Signed By: Adine Morales MD 10/24/2022 21:07  Workstation: [id number]    LABORATORY STUDIES Abnormal Labs Reviewed  METABOLIC PANEL, BASIC - Abnormal; Notable for the following components:      Result Value   Glucose 114 (*)    All other components within normal limits  HEPATIC FUNCTION PANEL - Abnormal; Notable for the following components:   Alkaline Phosphatase 124 (*)    AST/SGOT 40 (*)    All other components within normal limits  URINALYSIS, REFLEX WITH MICROSCOPIC - Abnormal; Notable for the following components:   Protein, Ur 30 (*)    Blood, Ur Moderate (*)    Ketones, Ur 40 (*)    RBC, Ur 8 (*)    Bacteria, Ur Occasional (*)    All other components within normal limits  CBC WITH AUTO DIFFERENTIAL - WAM AND NON-WAM - Abnormal; Notable for the following components:   WBC Count 14.9 (*)    Hemoglobin 17.8 (*)    Hematocrit 51.8 (*)    Abs Neutrophils 12.15 (*)    Abs Immature Grans 0.08 (*)    All other  components within normal limits   ED COURSE & MEDICAL DECISION MAKING   35 year old male patient with sudden onset left upper abdominal pain that started this morning and has progressed throughout the day.  CBC and BMP stable.  LFTs and lipase normal.  CT abdomen pelvis shows acute intramural hematoma involving the celiac trunk extending into the splenic and, hepatic arteries.  Discussed with Dr. Novosel and no surgical intervention necessary and recommends vascular and/or medicine consult for evaluation of vasculitis.  Discussed with Dr. Dovie who agrees to see patient once he gets to [facility]. Discussed with Dr Eward for admission and transfer to [facility].  Differential diagnosis includes, but not limited to diverticulitis, kidney stone, constipation  Medications Provided in Emergency Department:  Dilaudid, Zofran and labetalol  Social Determinants of Health/Special Considerations:  None identified  Disposition:  Admit to [facility]  FINAL IMPRESSION   Vasculitis hypertension  Portions of this note may be dictated using voice recognition software.  Variances in spelling and vocabulary are possible and unintentional.  Not all errors are caught/corrected.  Please notify the dino if any discrepancies are noted or if the meaning of any statement is not clear.  Wanda Servant, NP 10/24/22 2300  Cosigned by GORMAN Dorise Sax, MD at 10/24/2022 11:01 PM EDT Electronically signed by Wanda Servant, NP at 10/24/2022 11:00 PM EDT Electronically signed by GORMAN Dorise Sax, MD at 10/24/2022 11:01 PM EDT

## (undated) NOTE — Unmapped External Note (Signed)
 Formatting of this note is different from the original.  Physician Certification Statement for Medical Transport Complete for non-emergency scheduled and non-emergency unscheduled ambulance transport(s)  SECTION I - GENERAL INFORMATION Patient's Name: Leonard Gilbert CSN: [id number]  Flowsheet Row Most Recent Value  Certification status --  Initial transport date 10/25/22  Repetitive transport expiration date --  [facility]  Destination [facility] 1424    SECTION II - MEDICAL NECESSITY QUESTIONNAIRE  Non-emergency transportation by ambulance is appropriate if either: the beneficiary is bed confined, and it is documented that the beneficiary's condition is such that other methods of transport are contraindicated; OR, if his or her medical condition, regardless of bed confinement, is such that transportation by ambulance is medically required. (Bed confinement is not the sole criterion.)  To be bed confined the patient must be: (1) unable to get up from bed without assistance; AND (2) unable to ambulate; AND (3) unable to sit in a chair or wheelchair (Note: All three of the above conditions must be met in order for the patient to qualify as bed confined)  1)  Flowsheet Row Most Recent Value  Is the patient bed confined? No    Describe the Medical CONDITION of this patient AT THE TIME OF AMBULANCE TRANSPORTATION that requires the patient to be transported on a stretcher in an ambulance and why transport by other means is contraindicated by the patient's condition:  2)  Flowsheet Row Most Recent Value  Medical condition Vasculitis    Can this patient safely be transported in a wheelchair van (i.e., seated for the duration of the transport, and without a medical attendant?)  3)  Flowsheet Row Most Recent Value  Wheelchair fleeta? No    4) In addition to completing questions 1-3 above, please check any of the following conditions that apply*: *Note: supporting documentation for any boxes  checked must be maintained in the patient's medical records.   Advanced airway maintenance required  Cardiac/hemodynamic monitoring required   Confused, combative, lethargic, or comatose  Contractures   Danger to self/others or flight risk  Decubitus ulcers on buttocks, Grade II or greater   DVT requires elevation of a lower extremity  Isolation/special handling required   IV meds/fluids required  Maximum assistance required for transfers (2 or more)   Moderate/severe pain on movement  Non-healed fractures (pelvis, spine, hip)   Morbid obesity requires additional personnel/equipment to safely handle patient  Orthopedic device requiring special handling during transport (backboard, halo, use of pins in traction, etc.)   Third party assistance required to apply, administer or regulate or adjust oxygen en route (RARE)  Unable to maintain erect sitting position in a chair for time needed for transport   Other:     Flowsheet Row Most Recent Value  Additional conditions Cardiac/hemodynamic monitoring required    SECTION II - MEDICAL NECESSITY QUESTIONNAIRE  I certify that the above information is true and correct based on my evaluation of this patient, and represent that the patient requires transport by an ambulance due to the reasons documented on this form.  I understand that this information will be used by the Centers for Medicare and Medicaid Services (CMS) to support the determination of medical necessity for ambulance services, and that I have personal knowledge of the patient's condition at the time of transport.   Cosigned by Lang CHRISTELLA Peach, DO at 10/25/2022 12:39 AM EDT Electronically signed by Mylinda Almarie Mott, RN at 10/25/2022 12:04 AM EDT Electronically signed by Lang CHRISTELLA Peach, DO  at 10/25/2022 12:39 AM EDT

## (undated) NOTE — H&P (Signed)
 Formatting of this note is different from the original. Images from the original note were not included.   [facility] HOSPITALIST HISTORY AND PHYSICAL Date of Service: 10/25/2022  10:56  Patient Name:  Leonard Gilbert    DOB:  01-16-1976    MEDICAL RECORD [id number]   PCP: No primary care provider on file.  Chief Complaint: abdominal pain  History of present illness: Patient is a 23 y.o. male with no known PMH who presents from Home with abdominal pain. Pt reports having recent cough/upper resp congestion for the last few weeks. He and partner have had dry cough for >1 week but otherwise the patient has felt well. Yesterday AM he woke up with severe epigastric pain. This was assoc w/ nausea but no vomiting or diarrhea. BMs have been normal. No blood in stool. He was found to be severely hypertensive on arrival to the ER w/ BP 220/110s. He denies prior h/o HTN but doesn't see a doctor routinely. Despite improvement in BP he continues to have severe epigastric pain.  Review Of Systems: Constitutional:  Denies fevers, chills, weakness, or fatigue, loss of appetite, weight gain or weight loss.   HEENT:  Denies headache, hearing loss, blurred vision, double vision, nasal congestion, rhinorrhea, epistaxis, sinus pain, dysphagia, or sore throat.   Respiratory:  Denies SOB, cough, sputum production, hemoptysis, or wheezing.   Cardiovascular:  Denies chest pain, palpitations, syncope, presyncope, tachycardia, edema, orthopnea, paroxysmal nocturnal dyspnea, or dyspnea on exertion.   Gastrointestinal: Denies vomiting, diarrhea, constipation, reflux symptoms, hematemesis, melena, or hematochezia.  (+) abdominal pain, nausea Musculoskeletal:  Denies neck pain, back pain, joint pain, joint swelling or tenderness, muscle pains or spasms.   Neurologic:  Denies numbness, tingling, other sensory changes, sudden weakness in arms or legs.  Denies confusion, headache, dizziness, memory loss, or tremors.   Genitourinary:   Denies urinary frequency, nocturia, urgency, incontinence, dysuria, or hematuria.   Endocrine:  Denies heat or cold intolerance, polydipsia, or polyuria.   Integument:  Denies new rashes or lesions, pruritus, or dryness of skin.   Psychiatric:  Denies anxiety, depression, or sleeping problems.    Past Medical History: Past Medical History:  Diagnosis Date  ? Anxiety   ? GERD (gastroesophageal reflux disease)    Past Surgical History: History reviewed. No pertinent surgical history.  Allergies: No Known Allergies  Medications: Prior to Admission medications   Not on File   Family History: History reviewed. No pertinent family history.  Social History:  reports that he has been smoking cigarettes. He has a 30.00 pack-year smoking history. He has never used smokeless tobacco. He reports current alcohol use. He reports that he does not currently use drugs.   Physical Exam: BP (!) 156/76 (BP Location: Right upper arm, Patient Position: Semi- fowlers)   Pulse 56   Temp 97.6 F (36.4 C) (Oral)   Resp 16   Ht 5' 11 (1.803 m)   Wt 165 lb (74.8 kg)   SpO2 97%   BMI 23.01 kg/m  Temp (24hrs), Avg:97.8 F (36.6 C), Min:97.6 F (36.4 C), Max:98.1 F (36.7 C)  Weight on admission Weight: 165 lb (74.8 kg) Body mass index is 23.01 kg/m. General:  Well-developed, well-nourished, in moderate distress. Alert and oriented to person, place, and time. HEENT:  Normocephalic, atraumatic. PERRL. EOMI.  Moist mucous membranes. Pulmonary/Chest:  Clear to auscultation bilaterally  Cardiovascular:  Regular rate and rhythm  Abdomen:  Normal bowel sounds. Abdomen soft, epigastric tenderness, and non-distended. Voluntary guarding. Extremities:  No  clubbing, cyanosis, or edema. Distal pulses symmetric and 2+. Psychiatric:  Alert. Cooperative. Normal mood and affect.  Neuro:  Cranial nerves II - XII intact.  Sensory and motor grossly intact.  Labs:  Recent Results (from the past 24 hour(s))   Basic Metabolic Profile (BMP): Glucose, BUN, CO2-, Na+, K+, Cl-, Ca, SCr, Anion Gap   Collection Time: 10/24/22  8:01 PM  Result Value Ref Range   Sodium 139 136 - 145 mmol/L   Potassium 5.1 3.4 - 5.1 mmol/L   Chloride 102 98 - 107 mmol/L   Carbon Dioxide 25.6 20.0 - 31.0 mmol/L   Anion Gap 11    Creatinine 0.87 0.70 - 1.30 mg/dL   GFR, Estimated >=39 fO/fpw/8.26f7   Urea Nitrogen, Blood 9 9 - 23 mg/dL   Glucose 885 (H) 74 - 106 mg/dL   Calcium 9.3 8.7 - 89.5 mg/dL  Hepatic Function Panel: Alb, Tpro, Dbili, Tbili, ALT, AST, ALP   Collection Time: 10/24/22  8:01 PM  Result Value Ref Range   Albumin 4.0 3.4 - 5.0 g/dL   Alkaline Phosphatase 124 (H) 46 - 116 U/L   ALT/SGPT 45 10 - 49 U/L   AST/SGOT 40 (H) <34 U/L   Bilirubin, Direct <0.1 <=0.3 mg/dL   Bilirubin, Total 0.8 0.3 - 1.2 mg/dL   Total Protein 7.9 5.7 - 8.2 g/dL   Bilirubin, Indirect    Lipase   Collection Time: 10/24/22  8:01 PM  Result Value Ref Range   Lipase 22 12 - 53 U/L  CBC with Auto Differential   Collection Time: 10/24/22  8:01 PM  Result Value Ref Range   WBC Count 14.9 (H) 4.0 - 10.0 K/uL   RBC Count 5.79 4.63 - 6.08 M/uL   Hemoglobin 17.8 (H) 13.7 - 17.5 g/dL   Hematocrit 48.1 (H) 59.8 - 51.0 %   MCV 89.5 79.0 - 92.2 fL   MCH 30.7 25.6 - 32.2 pg   MCHC 34.4 32.2 - 36.5 g/dL   RDW 86.4 88.3 - 85.5 %   Platelets 203 163 - 369 K/uL   MPV 10.2 9.4 - 12.4 fL   Abs NRBC 0.01 0.00 - 0.01 K/uL   Neutrophils 81.9 %   Lymphocytes 12.5 %   Monocytes 4.4 %   Eosinophils 0.4 %   Basophils 0.3 %   Immature Grans 0.5 %   NRBC 0.0 0.0 - 0.2 per 100 WBC's   Abs Neutrophils 12.15 (H) 1.60 - 6.10 K/uL   Abs Lymphs 1.86 1.20 - 3.70 K/uL   Abs Monos 0.66 0.20 - 0.80 K/uL   Abs Eosinophils 0.06 0.00 - 0.50 K/uL   Abs Basophils 0.04 0.00 - 0.10 K/uL   Abs Immature Grans 0.08 (H) 0.00 - 0.03 K/uL   ANC 12.15 K/uL   Differential Type Auto   C Reactive Protein   Collection Time: 10/24/22  8:01 PM  Result Value  Ref Range   CRP 0.6 <1.0 mg/dL  Urinalysis, Reflex Microscopic - No Symptoms   Collection Time: 10/24/22  8:18 PM  Result Value Ref Range   Color, Ur Yellow Colorless, Light Yellow, Yellow, Straw   Clarity, Ur Clear Clear   Glucose, Ur Negative Negative mg/dL   Protein, Ur 30 (A) Negative, Trace mg/dL   Bilirubin, Ur Negative Negative   Urobilinogen, Ur 0.2 0.2 - 1.0 mg/dL mg/dL   pH, Ur 5.5 5.0 - 8.0   Blood, Ur Moderate (A) Negative   Ketones, Ur 40 (A) Negative mg/dL  Nitrite, Ur Negative Negative   Leukocytes, Ur Negative Negative   Specific Gravity, Ur 1.020 1.001 - 1.030   RBC, Ur 8 (H) 0 - 3 /HPF   WBC, Ur 3 0 - 3 /HPF   Bacteria, Ur Occasional (A) None Seen /HPF  Drug Screen Qualitative, Urine - UDS   Collection Time: 10/24/22  8:18 PM  Result Value Ref Range   Amphetamines, Ur Negative Neg   Barbiturates, Ur Negative Neg   Benzodiazepine, Ur Negative Neg   Buprenorphine, Ur Negative Neg   Cocaine, Ur Negative Neg   Methamphetamines, Ur Negative Neg   Methadone, Ur Negative Neg   Opiates, Ur Negative Neg   Oxycodone , Ur Negative Neg   PCP, Ur Negative Neg   THC, Ur Positive (A) Neg   Tricyclic Antidepressants, Ur Negative Neg  Basic Metabolic Profile (BMP): Glucose, BUN, CO2-, Na+, K+, Cl-, Ca, SCr, Anion Gap   Collection Time: 10/25/22  5:21 AM  Result Value Ref Range   Sodium 143 136 - 145 mmol/L   Potassium 4.0 3.4 - 5.1 mmol/L   Chloride 112 (H) 98 - 107 mmol/L   Carbon Dioxide 24.0 20.0 - 31.0 mmol/L   Anion Gap 7    Creatinine 0.86 0.70 - 1.30 mg/dL   GFR, Estimated >=39 fO/fpw/8.26f7   Urea Nitrogen, Blood 8 (L) 9 - 23 mg/dL   Glucose 879 (H) 74 - 106 mg/dL   Calcium 9.1 8.7 - 89.5 mg/dL  C Reactive Protein   Collection Time: 10/25/22  5:21 AM  Result Value Ref Range   CRP 2.0 (H) <1.0 mg/dL  Magnesium   Collection Time: 10/25/22  5:21 AM  Result Value Ref Range   Magnesium 1.9 1.6 - 2.6 mg/dL  Hepatic Function Panel: Alb, Tpro, Dbili, Tbili,  ALT, AST, ALP   Collection Time: 10/25/22  5:21 AM  Result Value Ref Range   Albumin 3.9 3.4 - 5.0 g/dL   Alkaline Phosphatase 105 46 - 116 U/L   ALT/SGPT 32 10 - 49 U/L   AST/SGOT 19 <34 U/L   Bilirubin, Direct 0.2 <=0.3 mg/dL   Bilirubin, Total 0.7 0.3 - 1.2 mg/dL   Total Protein 7.1 5.7 - 8.2 g/dL   Bilirubin, Indirect 0.5 mg/dL  CBC with Auto Differential   Collection Time: 10/25/22  5:21 AM  Result Value Ref Range   WBC Count 12.9 (H) 4.0 - 10.0 K/uL   RBC Count 5.38 4.63 - 6.08 M/uL   Hemoglobin 16.9 13.7 - 17.5 g/dL   Hematocrit 50.1 59.8 - 51.0 %   MCV 92.6 (H) 79.0 - 92.2 fL   MCH 31.4 25.6 - 32.2 pg   MCHC 33.9 32.2 - 36.5 g/dL   RDW 85.9 88.3 - 85.5 %   Platelets 210 163 - 369 K/uL   MPV 10.4 9.4 - 12.4 fL   Abs NRBC 0.01 0.00 - 0.01 K/uL   Neutrophils 76.8 %   Lymphocytes 17.5 %   Monocytes 4.5 %   Eosinophils 0.4 %   Basophils 0.2 %   Immature Grans 0.6 %   NRBC 0.0 0.0 - 0.2 per 100 WBC's   Abs Neutrophils 9.91 (H) 1.60 - 6.10 K/uL   Abs Lymphs 2.25 1.20 - 3.70 K/uL   Abs Monos 0.58 0.20 - 0.80 K/uL   Abs Eosinophils 0.05 0.00 - 0.50 K/uL   Abs Basophils 0.02 0.00 - 0.10 K/uL   Abs Immature Grans 0.08 (H) 0.00 - 0.03 K/uL   ANC 9.91 K/uL  Differential Type Auto   Lipase   Collection Time: 10/25/22  5:21 AM  Result Value Ref Range   Lipase 86 (H) 12 - 53 U/L   Diagnostics:  Cat Scan Abdomen and Pelvis with IV Contrast Only  Result Date: 10/24/2022 CT SCAN OF THE ABDOMEN AND PELVIS WITH IV CONTRAST: COMPARISON: None Available at Time of Dictation TECHNIQUE: Utilizing automatic exposure control, helically acquired images were obtained from the lung bases through the symphysis pubis following IV contrast administration. CONTRAST: 100 mL iopamidoL (ISOVUE-370) 76 % injection 100 mL, no reaction. FINDINGS: Ill-defined high attenuation stranding surrounds the celiac trunk extending along the common hepatic and splenic arteries. Suggestion of underlying  intramural hematoma. Findings asymmetric left with stranding/fluid extending caudally along the left retromesenteric plane. No artery luminal irregularity. No aneurysm. Mild luminal narrowing. Normal SMA and renal arteries. Hepatic steatosis, small cyst peripherally hepatic segment 4A. Normal gallbladder and biliary tree. Normal pancreas, spleen, adrenal glands, kidneys. Abdominal aorta normal caliber, mild scattered atherosclerosis. No bowel obstruction or inflammatory changes. No free fluid. Normal bladder and prostate. Mild lumbar spondylosis, no significant canal stenosis. Mild hip osteoarthritis.    IMPRESSION: ACUTE INTRAMURAL HEMATOMA INVOLVING THE CELIAC TRUNK EXTENDING INTO THE SPLENIC AND COMMON HEPATIC ARTERIES. DIFFERENTIAL CONSIDERATIONS INCLUDE SEGMENTAL ARTERIAL MEDIOLYSIS AND INFLAMMATORY ARTERITIDES.  THERE IS ILL-DEFINED STRANDING SURROUNDING THE INVOLVED ARTERIAL SEGMENTS INDICATIVE OF INFLAMMATORY CHANGES AND/OR BLEEDING. Electronically Signed By: Adine Morales MD 10/24/2022 21:07 Workstation: [id number]  I have personally reviewed the images and agree with assessment as outlined above.  Assessment and Plan:  Abdominal pain 2/2 intramural hematoma of celiac artery.  CT w/ dissection of celiac artery extending into splenic and common hepatic arteries  Possibly secondary to vasculitis vs hypertensive emergency w/ dissection  Check ESR, CRP, ANCA, hepatitis panel, HIV  Start coreg, goal SBP <140  Start solumedrol 1 mg/kg daily (at least until vasculitis is ruled out)  CTA abdomen pending  Appreciate input of Vascular Surgery - discussed w/ Dr. Dovie  Hypertensive urgency vs emergency.  Add coreg, use PRN labetalol or hydralazine   Cough and URI.  Check COVID/flu and respiratory panel  Disposition: Floor DVT Prophylaxis: SCD's Code Status: full code HCPOA: SO Carlin Myron  Extensive review of inpatient and outpatient records was performed in evaluation of this  patient and is reflected in the medical history noted above, including prior test results and consultant notes.    MDM: PROBLEM COMPLEXITY - acute intramural dissection of celiac artery - high DATA COMPLEXITY - (moderate 1/3, high 2/3) Category 1 (3 of 4) ? Reviewed results of tests/studies CBC BMP Category 2 ? I have personally reviewed the CT A/P and by my interpretation abnormal celiac artery Category 3 ? Discussed w/ Dr Dovie. RISKS - Decision to admit to hospital - high  INPATIENT ADMISSION LEVEL 3 - [id number]  Tinnie Blush, MD 10/25/2022 10:56   [facility]  For Documentation Purposes: Fluid and electrolyte disorder: euvolemic, (POA) Plan: repeat BMP in AM  Abnormal blood counts: N/A Plan: N/A  Nutritional Status: Body mass index is 23.01 kg/m. normal weight Plan: N/A  Coagulation defect: not present Plan: N/A  Elevated LFTs/transaminitis due to N/A Plan: N/A  Debility: Impaired mobility due to medical condition, (POA) Plan: encourage OOB/mobility   Addendum: Still w/ severely elevated BPs despite scheduled coreg. Pt requiring frequent PRNs. Will place on cardene drip and transfer to PCU.  Electronically signed by Tinnie Blush, MD at 10/25/2022 11:07 AM EDT Electronically signed by Tinnie Blush, MD  at 10/25/2022 12:25 PM EDT

## (undated) NOTE — Consults (Signed)
 Formatting of this note is different from the original. Vascular Surgery Consult Note:  Requesting Physician: Consulting Physician:  Tanda, MD. Dovie, MD.   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  Patient reports that yesterday morning around 7am, he had an acute onset of sharp abdominal pain that was localized in his LUQ. He says it is difficulty to characterize what the pain feels like, he has never felt anything like this before. Pain persisted so he presented to the ED for evaluation. Reports associated nausea and one small episode of non-bloody emesis. He also reports nonbloody diarrhea, but says this is normal for him. He denies fever or chills.  He denies ever experiencing similar symptoms in the past. He denies any history of autoimmune disorder, IBD, RA, or vasculitis. He denies family history of autoimmune disease or vasculitis He denies any history of DVT or PE. He says that he thinks that his father was diagnosed with a blood clot in the past but no other known family members w/ clotting disorders.   He denies use of antiplatelets or anticoagulants   Allergies: Patient denies  Medications: Patient denies taking any home medications  Past Medical History: Past Medical History:  Diagnosis Date   Anxiety    GERD (gastroesophageal reflux disease)     Past Surgical History: Bilateral inguinal hernia repair has a child, denies ever undergoing any other surgical  procedures  Past Family History: Reports that father was diagnosed with a blood clot in the past, he denies any other known family history of clotting or bleeding disorders. Denies family history of autoimmune disorders, IBD, RA, etc. Denies family history of vasculitis. Denies family history of anesthesia complications  Social History: Social History   Tobacco Use   Smoking status: Every Day    Packs/day: 1.00    Years: 30.00    Additional pack years: 0.00    Total pack years: 30.00    Types: Cigarettes   Smokeless tobacco: Never  Substance Use Topics   Alcohol use: Yes    Comment: 1 whiskey glass per day    Review of Systems  Constitutional: Negative for chills and fever.  HENT: Negative for congestion, sinus pain and sore throat.   Eyes: Negative for blurred vision, double vision and discharge.  Respiratory: Negative for cough, shortness of breath and wheezing.   Cardiovascular: Negative for chest pain and palpitations.  Gastrointestinal: Positive for abdominal pain, diarrhea, nausea and vomiting. Negative for blood in stool, constipation and melena.  Genitourinary: Negative for flank pain, hematuria and urgency.  Musculoskeletal: Negative for myalgias and neck pain.  Neurological: Negative for dizziness and sensory change.  Endo/Heme/Allergies: Does not bruise/bleed easily.  Psychiatric/Behavioral: Negative for depression and suicidal ideas.    Physical Exam: Temp (24hrs), Avg:97.8 F (36.6 C), Min:97.6 F (36.4 C), Max:98.1 F (36.7 C)  BP (!) 156/76 (BP Location: Right upper arm, Patient Position: Semi- fowlers)   Pulse 60   Temp 97.6 F (36.4 C) (Oral)   Resp 16   Ht 5' 11 (1.803 m)  Wt 165 lb (74.8 kg)   SpO2 97%   BMI 23.01 kg/m    Gen: NAD, appears stated age HEENT: NCAT Cards: regular rate Resp: Normal work of breathing on room air Abd: soft, moderate LUQ tenderness w/o rebound or peritonitis, remaining abdomen non-tender,  nondistended Genitourinary: deferred Exts: warm, well perfused  Pulses: b/l palpable radial/femoral/DP pulses Skin: warm & dry  Neurologic: CN II-XII grossly intact, sensation grossly intact  Labs & Studies: Recent Labs  Lab 10/25/22 0521 10/24/22 2001  WBC 12.9* 14.9*  HGB 16.9 17.8*  HCT 49.8 51.8*  PLT 210 203   Recent Labs  Lab 10/25/22 0521 10/24/22 2001  NA 143 139  K 4.0 5.1  CL 112* 102  CO2 24.0 25.6  BUN 8* 9  CREATININE 0.86 0.87  GLUC 120* 114*  MG 1.9  --    Recent Labs  Lab 10/25/22 0521 10/24/22 2001  TBIL 0.7 0.8  DBIL 0.2 <0.1  AST 19 40*  ALT 32 45  ALK 105 124*  ALB 3.9 4.0  TP 7.1 7.9   Micro: None indicated  Imaging: Cat Scan Abdomen and Pelvis with IV Contrast Only Narrative: CT SCAN OF THE ABDOMEN AND PELVIS WITH IV CONTRAST:  COMPARISON: None Available at Time of Dictation  TECHNIQUE: Utilizing automatic exposure control, helically acquired images were obtained from the lung bases through the symphysis pubis following IV contrast administration.  CONTRAST: 100 mL iopamidoL (ISOVUE-370) 76 % injection 100 mL, no reaction.   FINDINGS:  Ill-defined high attenuation stranding surrounds the celiac trunk extending along the common hepatic and splenic arteries. Suggestion of underlying intramural hematoma. Findings asymmetric left with stranding/fluid extending caudally along the left retromesenteric plane. No artery luminal irregularity. No aneurysm. Mild luminal narrowing. Normal SMA and renal arteries.  Hepatic steatosis, small cyst peripherally hepatic segment 4A. Normal gallbladder and biliary tree. Normal pancreas, spleen, adrenal glands, kidneys.  Abdominal aorta normal caliber, mild scattered atherosclerosis.  No bowel obstruction or inflammatory changes. No free fluid.  Normal bladder and prostate.  Mild lumbar spondylosis, no significant canal stenosis. Mild hip osteoarthritis.  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 DIFFERENTIAL CONSIDERATIONS INCLUDE SEGMENTAL ARTERIAL MEDIOLYSIS AND INFLAMMATORY ARTERITIDES.    THERE IS ILL-DEFINED STRANDING SURROUNDING THE INVOLVED ARTERIAL SEGMENTS INDICATIVE OF INFLAMMATORY CHANGES AND/OR BLEEDING.  Electronically Signed By: Adine Morales MD 10/24/2022 21:07 Workstation: [id number]   Assessment: Deward LITTIE Moulds is a 22 y.o. 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 Patient denies history of autoimmune disorder or vasculitis.  CT scan reviewed, the stranding noted around the arterial segments appears to all be inflammatory changes, lower suspicion that this is blood.  Fluid and electrolyte disorder: euvolemic, (POA) Plan: N/A  Abnormal blood counts: N/A Plan: N/A  Nutritional Status: Body mass index is 23.01 kg/m. normal weight Plan: N/A  Coagulation defect: not present Plan: N/A  Elevated LFTs/transaminitis due to N/A Plan: N/A  Debility: N/A Plan: N/A  Plan: CTA Abdomen/Pelvis ordered to further evaluate CTM abdominal exam and trend HgB q8hr Goal SBP < 160, discussed w/ primary team F/u ANA (pending), will defer decision regarding steroids  to primary team Keep NPO w/ mIVF  Thank you for this consult, we will follow along with you.   The case to be discussed with Dr. Dovie who will make adjustments to the above assessment and plan as deemed necessary in an addendum.  Kate Earnie Mash, MD, PGY-4 10/25/2022 08:51 Contact through [facility]  ATTENDING ATTESTATION   I saw and evaluated the patient, performing the key elements of the service. I discussed the findings, assessment and plan with the resident, and agree with the resident's exam findings and proposed management and treatment plans. My countersignature will demonstrate my concurrence with that documentation. 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 Pt with poorly controlled HTN upon admission No vascular surgical / interventional emergency at present Recommend medical management with pulse pressure control Plan repeat imaging next 24-48 hours with refinement of recommendations Pt and family updated and consent/agreee  Alm Dovie, MD [facility]    Electronically signed by Kate Earnie Mash, MD at 10/25/2022 10:30 AM EDT Electronically signed by Kate Earnie Mash, MD at 10/25/2022 10:30 AM EDT Electronically signed by Kate Earnie Mash, MD at 10/25/2022 10:36 AM EDT Electronically signed by Kate Earnie Mash, MD at 10/25/2022 12:43 PM EDT Electronically signed by Kate Earnie Mash, MD at 10/25/2022 12:43 PM EDT Electronically signed by Alm DELENA Dovie, MD at 10/28/2022  8:39 AM EDT Electronically signed by Alm DELENA Dovie, MD at 10/28/2022  8:39 AM EDT

## (undated) NOTE — Progress Notes (Signed)
 Formatting of this note might be different from the original. Pt medically progressing and No anticipated d/c needs but will f/u prior to discharge.  CM will continue to follow to ensure safe discharge.  Lonell Bellis  RN, BSN Case Manager [phone] Electronically signed by Arlyne Jenkins Nile Bellis, Case Manager at 10/26/2022 12:16 PM EDT

---

## 2012-11-20 ENCOUNTER — Encounter: Payer: Self-pay | Admitting: *Deleted

## 2012-11-20 ENCOUNTER — Emergency Department
Admission: EM | Admit: 2012-11-20 | Discharge: 2012-11-20 | Disposition: A | Discharge status: Home / Self Care | Payer: Self-pay | Source: Home / Self Care | Attending: Family Medicine | Admitting: Family Medicine

## 2012-11-20 DIAGNOSIS — M940 Chondrocostal junction syndrome [Tietze]: Secondary | ICD-10-CM

## 2012-11-20 DIAGNOSIS — R062 Wheezing: Secondary | ICD-10-CM

## 2012-11-20 MED ORDER — METHYLPREDNISOLONE SODIUM SUCC 125 MG IJ SOLR
125.0000 mg | Freq: Once | INTRAMUSCULAR | Status: AC
  Administered 2012-11-20: 125 mg via INTRAMUSCULAR

## 2012-11-20 MED ORDER — PREDNISONE 50 MG PO TABS: ORAL_TABLET | ORAL | Status: DC

## 2012-11-20 NOTE — ED Provider Notes (Signed)
History     CSN: [id number]  Arrival date & time 11/20/12  1058   First MD Initiated Contact with Patient 11/20/12 1115      Chief Complaint  Patient presents with  . Chest Pain  . Shortness of Breath   HPI  Chest wall pain and SOB x 1day Pt reports having reproducible CP with deep breathing since this am.  No known trauma.  CP is central in nature with no radiation.  4/10 at its worst.  No nausea or diaphoresis associated with pain.  + 1 PPD smoker.  No prior medical history.  No prior earlier cardiac history.     History reviewed. No pertinent past medical history.  Past Surgical History  Procedure Laterality Date  . Hernia repair      Family History  Problem Relation Age of Onset  . Cancer Mother     lung  . Heart failure Mother   . Diabetes Mother   . Cancer Father     lung  . Heart failure Father   . Diabetes Father     History  Substance Use Topics  . Smoking status: Current Every Day Smoker -- 1.00 packs/day  . Smokeless tobacco: Not on file  . Alcohol Use: Yes      Review of Systems  All other systems reviewed and are negative.    Allergies  Review of patient's allergies indicates no known allergies.  Home Medications  No current outpatient prescriptions on file.  BP 141/99  Pulse 68  Temp(Src) 98 F (36.7 C) (Oral)  Resp 18  Wt 175 lb (79.379 kg)  SpO2 99%  Physical Exam  Constitutional: He appears well-developed and well-nourished.  HENT:  Head: Normocephalic and atraumatic.  Eyes: Conjunctivae are normal. Pupils are equal, round, and reactive to light.  Neck: Normal range of motion.  Cardiovascular: Normal rate, regular rhythm and normal heart sounds.   Pulmonary/Chest: Effort normal. He has wheezes.  Abdominal: Soft.  Musculoskeletal: Normal range of motion.  Neurological: He is alert.  Skin: Skin is warm.    ED Course  Procedures (including critical care time)  Labs Reviewed - No data to display No results  found.  EKG: NSR, no acute ST or T wave abnormalities .  1. Costochondritis   2. Wheezing       MDM  Solumedrol 125 mg IM x1  Prednisone x 5 days in setting of wheezing, likely component of COPD.  Pt is uninsured. Discussed CXR, however deferred this as pt O2 sats at 99% with no resp distress.  Discussed resp and infectious red flags at length.  Follow up as needed.      The patient and/or caregiver has been counseled thoroughly with regard to treatment plan and/or medications prescribed including dosage, schedule, interactions, rationale for use, and possible side effects and they verbalize understanding. Diagnoses and expected course of recovery discussed and will return if not improved as expected or if the condition worsens. Patient and/or caregiver verbalized understanding.             Doree Albee, MD 11/20/12 [phone]

## 2012-11-20 NOTE — Discharge Instructions (Signed)
Costochondritis Costochondritis is a condition in which the tissue (cartilage) that connects your ribs with your breastbone (sternum) becomes irritated and causes chest pain.  HOME CARE  Avoid activities that wear you out.  Do not strain your ribs. Avoid activities that use your:  Chest.  Belly.  Side muscles.  Put ice on the area.  Put ice in a plastic bag.  Place a towel betwen your skin and the bag.  Leave the ice on for 15 to 20 minutes, 3 to 4 times a day.  Only take medicine as told by your doctor. GET HELP RIGHT AWAY IF:   Your pain gets worse.  You are very uncomfortable.  You have a fever.  You have trouble breathing.  You cough up blood.  You start sweating or throwing up (vomiting).  You develop new, unexplained symptoms. MAKE SURE YOU:   Understand these instructions.  Will watch your condition.  Will get help right away if you are not doing well or get worse. Document Released: 12/14/2007 Document Revised: 09/19/2011 Document Reviewed: 12/14/2007 [facility] Patient Information 2013 [place], [place]. Smoking Cessation Quitting smoking is important to your health and has many advantages. However, it is not always easy to quit since nicotine is a very addictive drug. Often times, people try 3 times or more before being able to quit. This document explains the best ways for you to prepare to quit smoking. Quitting takes hard work and a lot of effort, but you can do it. ADVANTAGES OF QUITTING SMOKING  You will live longer, feel better, and live better.  Your body will feel the impact of quitting smoking almost immediately.  Within 20 minutes, blood pressure decreases. Your pulse returns to its normal level.  After 8 hours, carbon monoxide levels in the blood return to normal. Your oxygen level increases.  After 24 hours, the chance of having a heart attack starts to decrease. Your breath, hair, and body stop smelling like smoke.  After 48 hours,  damaged nerve endings begin to recover. Your sense of taste and smell improve.  After 72 hours, the body is virtually free of nicotine. Your bronchial tubes relax and breathing becomes easier.  After 2 to 12 weeks, lungs can hold more air. Exercise becomes easier and circulation improves.  The risk of having a heart attack, stroke, cancer, or lung disease is greatly reduced.  After 1 year, the risk of coronary heart disease is cut in half.  After 5 years, the risk of stroke falls to the same as a nonsmoker.  After 10 years, the risk of lung cancer is cut in half and the risk of other cancers decreases significantly.  After 15 years, the risk of coronary heart disease drops, usually to the level of a nonsmoker.  If you are pregnant, quitting smoking will improve your chances of having a healthy baby.  The people you live with, especially any children, will be healthier.  You will have extra money to spend on things other than cigarettes. QUESTIONS TO THINK ABOUT BEFORE ATTEMPTING TO QUIT You may want to talk about your answers with your caregiver.  Why do you want to quit?  If you tried to quit in the past, what helped and what did not?  What will be the most difficult situations for you after you quit? How will you plan to handle them?  Who can help you through the tough times? Your family? Friends? A caregiver?  What pleasures do you get from smoking? What ways  can you still get pleasure if you quit? Here are some questions to ask your caregiver:  How can you help me to be successful at quitting?  What medicine do you think would be best for me and how should I take it?  What should I do if I need more help?  What is smoking withdrawal like? How can I get information on withdrawal? GET READY  Set a quit date.  Change your environment by getting rid of all cigarettes, ashtrays, matches, and lighters in your home, car, or work. Do not let people smoke in your  home.  Review your past attempts to quit. Think about what worked and what did not. GET SUPPORT AND ENCOURAGEMENT You have a better chance of being successful if you have help. You can get support in many ways.  Tell your family, friends, and co-workers that you are going to quit and need their support. Ask them not to smoke around you.  Get individual, group, or telephone counseling and support. Programs are available at [employer] and health centers. Call your local health department for information about programs in your area.  Spiritual beliefs and practices may help some smokers quit.  Download a "quit meter" on your computer to keep track of quit statistics, such as how long you have gone without smoking, cigarettes not smoked, and money saved.  Get a self-help book about quitting smoking and staying off of tobacco. LEARN NEW SKILLS AND BEHAVIORS  Distract yourself from urges to smoke. Talk to someone, go for a walk, or occupy your time with a task.  Change your normal routine. Take a different route to work. Drink tea instead of coffee. Eat breakfast in a different place.  Reduce your stress. Take a hot bath, exercise, or read a book.  Plan something enjoyable to do every day. Reward yourself for not smoking.  Explore interactive web-based programs that specialize in helping you quit. GET MEDICINE AND USE IT CORRECTLY Medicines can help you stop smoking and decrease the urge to smoke. Combining medicine with the above behavioral methods and support can greatly increase your chances of successfully quitting smoking.  Nicotine replacement therapy helps deliver nicotine to your body without the negative effects and risks of smoking. Nicotine replacement therapy includes nicotine gum, lozenges, inhalers, nasal sprays, and skin patches. Some may be available over-the-counter and others require a prescription.  Antidepressant medicine helps people abstain from smoking, but how  this works is unknown. This medicine is available by prescription.  Nicotinic receptor partial agonist medicine simulates the effect of nicotine in your brain. This medicine is available by prescription. Ask your caregiver for advice about which medicines to use and how to use them based on your health history. Your caregiver will tell you what side effects to look out for if you choose to be on a medicine or therapy. Carefully read the information on the package. Do not use any other product containing nicotine while using a nicotine replacement product.  RELAPSE OR DIFFICULT SITUATIONS Most relapses occur within the first 3 months after quitting. Do not be discouraged if you start smoking again. Remember, most people try several times before finally quitting. You may have symptoms of withdrawal because your body is used to nicotine. You may crave cigarettes, be irritable, feel very hungry, cough often, get headaches, or have difficulty concentrating. The withdrawal symptoms are only temporary. They are strongest when you first quit, but they will go away within 10 14 days. To reduce  the chances of relapse, try to:  Avoid drinking alcohol. Drinking lowers your chances of successfully quitting.  Reduce the amount of caffeine you consume. Once you quit smoking, the amount of caffeine in your body increases and can give you symptoms, such as a rapid heartbeat, sweating, and anxiety.  Avoid smokers because they can make you want to smoke.  Do not let weight gain distract you. Many smokers will gain weight when they quit, usually less than 10 pounds. Eat a healthy diet and stay active. You can always lose the weight gained after you quit.  Find ways to improve your mood other than smoking. FOR MORE INFORMATION  www.smokefree.gov  Document Released: 06/21/2001 Document Revised: 12/27/2011 Document Reviewed: 10/06/2011 [facility] Patient Information 2013 [place], [place].

## 2012-11-20 NOTE — ED Notes (Signed)
Pt c/o cough, congestion, SOB, and LT sided chest pain with a deep breath x last night.

## 2013-05-08 ENCOUNTER — Emergency Department (INDEPENDENT_AMBULATORY_CARE_PROVIDER_SITE_OTHER)
Admission: EM | Admit: 2013-05-08 | Discharge: 2013-05-08 | Disposition: A | Discharge status: Home / Self Care | Payer: Self-pay | Source: Home / Self Care | Attending: Emergency Medicine | Admitting: Emergency Medicine

## 2013-05-08 ENCOUNTER — Encounter: Payer: Self-pay | Admitting: Emergency Medicine

## 2013-05-08 DIAGNOSIS — M5412 Radiculopathy, cervical region: Secondary | ICD-10-CM

## 2013-05-08 DIAGNOSIS — S161XXA Strain of muscle, fascia and tendon at neck level, initial encounter: Secondary | ICD-10-CM

## 2013-05-08 DIAGNOSIS — S139XXA Sprain of joints and ligaments of unspecified parts of neck, initial encounter: Secondary | ICD-10-CM

## 2013-05-08 MED ORDER — CARISOPRODOL 350 MG PO TABS: ORAL_TABLET | ORAL | Status: DC

## 2013-05-08 MED ORDER — PREDNISONE (PAK) 10 MG PO TABS: ORAL_TABLET | ORAL | Status: DC

## 2013-05-08 MED ORDER — HYDROCODONE-ACETAMINOPHEN 5-325 MG PO TABS: 1.0000 | ORAL_TABLET | Freq: Four times a day (QID) | ORAL | Status: DC | PRN
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
## 2013-09-20 ENCOUNTER — Encounter: Payer: Self-pay | Admitting: Sports Medicine

## 2013-09-20 ENCOUNTER — Ambulatory Visit (INDEPENDENT_AMBULATORY_CARE_PROVIDER_SITE_OTHER): Payer: Self-pay | Admitting: Sports Medicine

## 2013-09-20 VITALS — BP 169/98 | HR 68 | Ht 71.0 in | Wt 166.0 lb

## 2013-09-20 DIAGNOSIS — L603 Nail dystrophy: Secondary | ICD-10-CM | POA: Insufficient documentation

## 2013-09-20 DIAGNOSIS — L600 Ingrowing nail: Secondary | ICD-10-CM

## 2013-09-20 MED ORDER — OXYCODONE-ACETAMINOPHEN 5-325 MG PO TABS: 1.0000 | ORAL_TABLET | Freq: Three times a day (TID) | ORAL | Status: DC | PRN

## 2013-09-20 NOTE — Progress Notes (Addendum)
  Subjective:    CC: Ingrown toenails  HPI:  Leonard Gilbert has had ingrown toenails for some time now, they're painful, difficult for him to walk. He desires removal. Pain is moderate, persistent.  Past medical history, Surgical history, Family history not pertinant except as noted below, Social history, Allergies, and medications have been entered into the medical record, reviewed, and no changes needed.   Review of Systems: No headache, visual changes, nausea, vomiting, diarrhea, constipation, dizziness, abdominal pain, skin rash, fevers, chills, night sweats, swollen lymph nodes, weight loss, chest pain, body aches, joint swelling, muscle aches, shortness of breath, mood changes, visual or auditory hallucinations.  Objective:    General: Well Developed, well nourished, and in no acute distress.  Neuro: Alert and oriented x3, extra-ocular muscles intact, sensation grossly intact.  HEENT: Normocephalic, atraumatic, pupils equal round reactive to light, neck supple, no masses, no lymphadenopathy, thyroid nonpalpable.  Skin: Warm and dry, no rashes noted.  Cardiac: Regular rate and rhythm, no murmurs rubs or gallops.  Respiratory: Clear to auscultation bilaterally. Not using accessory muscles, speaking in full sentences.  Abdominal: Soft, nontender, nondistended, positive bowel sounds, no masses, no organomegaly.  Musculoskeletal: Bilateral ingrown toenails. No paronychia.  Procedure:  Removal of left great toenail. Risks, benefits, alternatives explained to patient. Consent obtained. Time out conducted. Noted no overlying induration or erythema at site of injection. Toe cleaned with alcohol, then a total of 10 cc lidocaine 2% infiltrated at adjacent webspaces at the location of the bifurcation of the common digital nerve to proper digital nerves.  Some lidocaine also infiltrated at hyponychium and under nail bed.  Adequate anesthesia ensured. Toe prepped and draped in a sterile fashion. Nail  elevator used to separate nail plate from nail bed. Clippers used to cut toenail in a longitudinal fashion to proximal nail fold and matrix. Hemostat then used to separate nail fragment from surrounding structures. Minor bleeding controlled with pressure and silver nitrate. Antibiotic ointment applied. Toe dressed. Advised to return if increased redness, swelling, drainage, fevers, or chills.  1 mg of lorazepam was given intravenously by physician into the cephalic vein before procedure. Patient tolerated this well.   Procedure:  Removal of right great toenail. Risks, benefits, alternatives explained to patient. Consent obtained. Time out conducted. Noted no overlying induration or erythema at site of injection. Toe cleaned with alcohol, then a total of 10 cc lidocaine 2% infiltrated at adjacent webspaces at the location of the bifurcation of the common digital nerve to proper digital nerves.  Some lidocaine also infiltrated at hyponychium and under nail bed.  Adequate anesthesia ensured. Toe prepped and draped in a sterile fashion. Nail elevator used to separate nail plate from nail bed. Clippers used to cut toenail in a longitudinal fashion to proximal nail fold and matrix. Hemostat then used to separate nail fragment from surrounding structures. Minor bleeding controlled with pressure and silver nitrate. Antibiotic ointment applied. Toe dressed. Advised to return if increased redness, swelling, drainage, fevers, or chills.  Impression and Recommendations:    The patient was counselled, risk factors were discussed, anticipatory guidance given.  I spent 60 minutes with this patient, greater than 50% was face-to-face time counseling regarding the above diagnoses.

## 2013-09-20 NOTE — Addendum Note (Signed)
Addended by: Monica BectonHEKKEKANDAM, Chantay Whitelock J on: 09/20/2013 04:01 PM   Modules accepted: Level of Service

## 2013-09-20 NOTE — Assessment & Plan Note (Signed)
Bilateral, nail removal as above. 1 mg of Ativan given intravenously before procedure for anxiolysis. Return to see me in one week for wound check. Percocet for pain.

## 2013-09-23 ENCOUNTER — Telehealth: Payer: Self-pay | Admitting: Sports Medicine

## 2013-09-23 ENCOUNTER — Encounter: Payer: Self-pay | Admitting: Sports Medicine

## 2013-09-23 NOTE — Telephone Encounter (Signed)
Note has been faxed to patient supervisor. Rhonda Cunningham,CMA

## 2013-09-23 NOTE — Telephone Encounter (Signed)
Note is in my box. 

## 2013-09-23 NOTE — Telephone Encounter (Signed)
Patient walked-in request to have an out of work note to be out until Wednesday, return back to work Thursday. Req to have faxed down to [phone]. Thanks

## 2013-09-27 ENCOUNTER — Ambulatory Visit: Payer: Self-pay | Admitting: Sports Medicine

## 2013-09-27 ENCOUNTER — Telehealth: Payer: Self-pay

## 2013-09-27 DIAGNOSIS — L600 Ingrowing nail: Secondary | ICD-10-CM

## 2013-09-27 MED ORDER — OXYCODONE-ACETAMINOPHEN 5-325 MG PO TABS: 1.0000 | ORAL_TABLET | Freq: Three times a day (TID) | ORAL | Status: DC | PRN

## 2013-09-27 NOTE — Telephone Encounter (Signed)
Patient request refill on Percocet. Matthew Pais,CMA

## 2013-09-27 NOTE — Telephone Encounter (Signed)
Prescription is in my box. Went downstairs this morning to see how he was doing, everything is improving as expected.

## 2013-09-27 NOTE — Telephone Encounter (Signed)
Patient has been informed that Rx is ready for pickup. Rhonda Cunningham,CMA  

## 2014-04-09 ENCOUNTER — Telehealth: Payer: Self-pay | Admitting: Sports Medicine

## 2014-04-09 MED ORDER — TRAMADOL HCL 50 MG PO TABS: ORAL_TABLET | ORAL | Status: DC

## 2014-04-09 NOTE — Telephone Encounter (Signed)
Leonard Gilbert has recurrent pain at both great toenails, he is post excision 9 months ago. We are going to do a repeat excision, this time with phenol treatment. Tramadol for pain in the meantime.

## 2014-05-01 ENCOUNTER — Ambulatory Visit (INDEPENDENT_AMBULATORY_CARE_PROVIDER_SITE_OTHER): Payer: Self-pay | Admitting: Sports Medicine

## 2014-05-01 ENCOUNTER — Encounter: Payer: Self-pay | Admitting: Sports Medicine

## 2014-05-01 VITALS — BP 131/88 | HR 66 | Ht 70.0 in | Wt 163.0 lb

## 2014-05-01 DIAGNOSIS — L600 Ingrowing nail: Secondary | ICD-10-CM

## 2014-05-01 MED ORDER — LORAZEPAM 2 MG/ML IJ SOLN
2.0000 mg | Freq: Once | INTRAMUSCULAR | Status: AC
Start: 2014-05-01 — End: 2014-05-01
  Administered 2014-05-01: 2 mg via INTRAVENOUS

## 2014-05-01 MED ORDER — HYDROCODONE-ACETAMINOPHEN 10-325 MG PO TABS: 1.0000 | ORAL_TABLET | Freq: Three times a day (TID) | ORAL | Status: DC | PRN

## 2014-05-01 NOTE — Assessment & Plan Note (Signed)
Removal of left and right great toenails with Ativan sedation and treatment with phenol. Return in one week for a wound check. Hydrocodone for pain.

## 2014-05-01 NOTE — Progress Notes (Addendum)
   2 mg of Ativan was given into the right antecubital vein by physician for procedural sedation.  Procedure:  Removal of left great toenail. Risks, benefits, alternatives explained to patient. Consent obtained. Time out conducted. Noted no overlying induration or erythema at site of injection. Toe cleaned with alcohol, then a total of 10 cc lidocaine 2% infiltrated at adjacent webspaces at the location of the bifurcation of the common digital nerve to proper digital nerves.  Some lidocaine also infiltrated at hyponychium and under nail bed.  Adequate anesthesia ensured. Toe prepped and draped in a sterile fashion. Nail elevator used to separate nail plate from nail bed. Hemostat then used to separate nail fragment from surrounding structures. Nail bed and matrix treated. Minor bleeding controlled with pressure Antibiotic ointment applied. Toe dressed. Advised to return if increased redness, swelling, drainage, fevers, or chills.  Procedure:  Removal of right great toenail. Risks, benefits, alternatives explained to patient. Consent obtained. Time out conducted. Noted no overlying induration or erythema at site of injection. Toe cleaned with alcohol, then a total of 10 cc lidocaine 2% infiltrated at adjacent webspaces at the location of the bifurcation of the common digital nerve to proper digital nerves.  Some lidocaine also infiltrated at hyponychium and under nail bed.  Adequate anesthesia ensured. Toe prepped and draped in a sterile fashion. Nail elevator used to separate nail plate from nail bed. Hemostat then used to separate nail fragment from surrounding structures. Nail bed and matrix treated. Minor bleeding controlled with pressure Antibiotic ointment applied. Toe dressed. Advised to return if increased redness, swelling, drainage, fevers, or chills.

## 2014-05-05 ENCOUNTER — Encounter: Payer: Self-pay | Admitting: Sports Medicine

## 2014-05-05 ENCOUNTER — Ambulatory Visit (INDEPENDENT_AMBULATORY_CARE_PROVIDER_SITE_OTHER): Payer: Self-pay | Admitting: Sports Medicine

## 2014-05-05 VITALS — BP 150/92 | HR 67 | Ht 70.0 in | Wt 163.0 lb

## 2014-05-05 DIAGNOSIS — L600 Ingrowing nail: Secondary | ICD-10-CM

## 2014-05-05 MED ORDER — OXYCODONE-ACETAMINOPHEN 10-325 MG PO TABS: 1.0000 | ORAL_TABLET | Freq: Three times a day (TID) | ORAL | Status: DC | PRN

## 2014-05-05 NOTE — Progress Notes (Signed)
  Subjective: 1 week post bilateral great toenail excision with phenol treatment, overall doing well. Still has some pain despite 10 mg of hydrocodone.   Objective: General: Well-developed, well-nourished, and in no acute distress. Great toes: Healing well, no signs of infection, some dried blood visible. Neurovascularly intact distally.  Assessment/plan:

## 2014-05-05 NOTE — Assessment & Plan Note (Signed)
Increasing to oxycodone tens. Return to see me in about a month.

## 2015-09-24 ENCOUNTER — Encounter: Payer: Self-pay | Admitting: Sports Medicine

## 2015-09-24 ENCOUNTER — Ambulatory Visit (INDEPENDENT_AMBULATORY_CARE_PROVIDER_SITE_OTHER): Payer: Self-pay | Admitting: Sports Medicine

## 2015-09-24 VITALS — BP 146/88 | HR 67 | Resp 16 | Wt 155.0 lb

## 2015-09-24 DIAGNOSIS — L600 Ingrowing nail: Secondary | ICD-10-CM

## 2015-09-24 MED ORDER — OXYCODONE-ACETAMINOPHEN 10-325 MG PO TABS: 1.0000 | ORAL_TABLET | Freq: Three times a day (TID) | ORAL | Status: DC | PRN

## 2015-09-24 NOTE — Progress Notes (Signed)
  Subjective:    CC: ingrown toenail  HPI: Leonard Gilbert returns, we used phenol and removed both of his great toenails almost 2 years ago, unfortunately they both grew back despite phenol, he desires repeat left great toenail excision, he will come back for the right one.  Past medical history, Surgical history, Family history not pertinant except as noted below, Social history, Allergies, and medications have been entered into the medical record, reviewed, and no changes needed.   Review of Systems: No fevers, chills, night sweats, weight loss, chest pain, or shortness of breath.   Objective:    General: Well Developed, well nourished, and in no acute distress.  Neuro: Alert and oriented x3, extra-ocular muscles intact, sensation grossly intact.  HEENT: Normocephalic, atraumatic, pupils equal round reactive to light, neck supple, no masses, no lymphadenopathy, thyroid nonpalpable.  Skin: Warm and dry, no rashes. Cardiac: Regular rate and rhythm, no murmurs rubs or gallops, no lower extremity edema.  Respiratory: Clear to auscultation bilaterally. Not using accessory muscles, speaking in full sentences.  Procedure:  Removal of left great toenail. Risks, benefits, alternatives explained to patient. Consent obtained. Time out conducted. Noted no overlying induration or erythema at site of injection. Toe cleaned with alcohol, then a total of 10c lidocaine 2% infiltrated at adjacent webspaces at the location of the bifurcation of the common digital nerve to proper digital nerves.  Some lidocaine also infiltrated at hyponychium and under nail bed.  Adequate anesthesia ensured. Toe prepped and draped in a sterile fashion. Nail elevator used to separate nail plate from nail bed. Hemostat then used to separate nail fragment from surrounding structures. Nail bed and matrix treated. Minor bleeding controlled with pressure and phenol. Antibiotic ointment applied. Toe dressed. Advised to return if  increased redness, swelling, drainage, fevers, or chills.  Impression and Recommendations:

## 2015-09-24 NOTE — Assessment & Plan Note (Signed)
Repeat left great toenail removal, it did grow back after initial phenol treatment. Return in one week for a wound check.

## 2015-09-30 ENCOUNTER — Other Ambulatory Visit: Payer: Self-pay | Admitting: Sports Medicine

## 2015-09-30 DIAGNOSIS — L600 Ingrowing nail: Secondary | ICD-10-CM

## 2015-09-30 MED ORDER — OXYCODONE-ACETAMINOPHEN 10-325 MG PO TABS: 1.0000 | ORAL_TABLET | Freq: Three times a day (TID) | ORAL | Status: DC | PRN

## 2016-02-22 ENCOUNTER — Encounter: Payer: Self-pay | Admitting: Podiatry

## 2016-02-22 ENCOUNTER — Ambulatory Visit (INDEPENDENT_AMBULATORY_CARE_PROVIDER_SITE_OTHER): Payer: BLUE CROSS/BLUE SHIELD | Admitting: Podiatry

## 2016-02-22 ENCOUNTER — Other Ambulatory Visit: Payer: Self-pay

## 2016-02-22 DIAGNOSIS — M2020 Hallux rigidus, unspecified foot: Secondary | ICD-10-CM

## 2016-02-22 DIAGNOSIS — L84 Corns and callosities: Secondary | ICD-10-CM | POA: Insufficient documentation

## 2016-02-22 DIAGNOSIS — M21969 Unspecified acquired deformity of unspecified lower leg: Secondary | ICD-10-CM

## 2016-02-22 DIAGNOSIS — M205X9 Other deformities of toe(s) (acquired), unspecified foot: Secondary | ICD-10-CM

## 2016-02-22 DIAGNOSIS — M79675 Pain in left toe(s): Secondary | ICD-10-CM

## 2016-02-22 NOTE — Patient Instructions (Signed)
Seen for left big toe pain.  Noted of weak and excess motion of the first metatarsal bone. Noted of pivoting action of the first metatarsal bone is compensated by the great toe with pain upon ambulation. Metatarsal binder dispensed to aid stability of the first metatarsal bone. May benefit from custom orthotics.

## 2016-02-22 NOTE — Progress Notes (Signed)
SUBJECTIVE: [age] y.o. year old male presents complaining of pain under left great toe medial IPJ area with callus build up. This pain came approximately about the time ingrown nail procedure was done about 9 months ago. Pain is shooting and sharp while on weight bearing.  On feet all day.   REVIEW OF SYSTEMS: Reviewed medical record and noted of pertinent informaiton.   OBJECTIVE: DERMATOLOGIC EXAMINATION: Nails: post nail surgery left great toe. Thick and deformed nail right great toe. Thick keratotic tissue build up at plantar medial at about IPJ of left great toe.  VASCULAR EXAMINATION OF LOWER LIMBS: Pedal pulses: All pedal pulses are palpable with normal pulsation.  Capillary Filling times within 3 seconds in all digits.  No edema or erythema noted. Temperature gradient from tibial crest to dorsum of foot is within normal bilateral.  NEUROLOGIC EXAMINATION OF THE LOWER LIMBS: All epicritic and tactile sensations grossly intact. MUSCULOSKELETAL EXAMINATION: Positive for excess sagittal plane motion of the first ray bilateral. Limited dorsiflexion of the 1st MPJ upon loading of forefoot.  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 Plantar IPJ pinch callus left great toe with pain upon weight bearing.  PLAN: Reviewed clinical findings and available treatment options; proper shoe gear, orthotics, metatarsal binder, and surgical procedure if conservative treatment fails and the condition gets worse. Metatarsal binder (medium) dispensed to assist stability of the first ray left. May benefit from custom orthotics. Referral made to have both feet x-ray.

## 2016-04-27 ENCOUNTER — Ambulatory Visit (INDEPENDENT_AMBULATORY_CARE_PROVIDER_SITE_OTHER): Payer: BLUE CROSS/BLUE SHIELD | Admitting: Sports Medicine

## 2016-04-27 ENCOUNTER — Encounter: Payer: Self-pay | Admitting: Sports Medicine

## 2016-04-27 DIAGNOSIS — L84 Corns and callosities: Secondary | ICD-10-CM

## 2016-04-27 MED ORDER — HYDROCODONE-ACETAMINOPHEN 5-325 MG PO TABS: 1.0000 | ORAL_TABLET | Freq: Three times a day (TID) | ORAL | 0 refills | Status: DC | PRN

## 2016-04-27 NOTE — Assessment & Plan Note (Signed)
Tender, swollen, inflamed callus on the left great toe. Injected under ultrasound guidance. Return to see me in one month. I have also added a dancers pad.

## 2016-04-27 NOTE — Progress Notes (Signed)
  Subjective:    CC: Left great toe pain  HPI: For the past several months all has had an increasing callus, painful on the medial aspect of his left great toe. He saw the podiatrist, unfortunately continues to have pain. Severe, localized over the callus. Doesn't remember stepping on anything, no constitutional symptoms, no recent trauma. Has not yet gotten a new set of custom orthotics.  Past medical history:  Negative.  See flowsheet/record as well for more information.  Surgical history: Negative.  See flowsheet/record as well for more information.  Family history: Negative.  See flowsheet/record as well for more information.  Social history: Negative.  See flowsheet/record as well for more information.  Allergies, and medications have been entered into the medical record, reviewed, and no changes needed.   Review of Systems: No fevers, chills, night sweats, weight loss, chest pain, or shortness of breath.   Objective:    General: Well Developed, well nourished, and in no acute distress.  Neuro: Alert and oriented x3, extra-ocular muscles intact, sensation grossly intact.  HEENT: Normocephalic, atraumatic, pupils equal round reactive to light, neck supple, no masses, no lymphadenopathy, thyroid nonpalpable.  Skin: Warm and dry, no rashes. Cardiac: Regular rate and rhythm, no murmurs rubs or gallops, no lower extremity edema.  Respiratory: Clear to auscultation bilaterally. Not using accessory muscles, speaking in full sentences. Left Foot: No visible erythema or swelling. Range of motion is full in all directions. Strength is 5/5 in all directions. No hallux valgus. No pes cavus or pes planus. Abnormal callus noted over the medial aspect of the left great distal phalanx No pain over the navicular prominence, or base of fifth metatarsal. No tenderness to palpation of the calcaneal insertion of plantar fascia. No pain at the Achilles insertion. No pain over the calcaneal bursa. No  pain of the retrocalcaneal bursa. No tenderness to palpation over the tarsals, metatarsals, or phalanges. No hallux rigidus or limitus. No tenderness palpation over interphalangeal joints. No pain with compression of the metatarsal heads. Neurovascularly intact distally.   Procedure: Real-time Ultrasound Guided Injection of left great toe callus Device: GE Logiq E  Verbal informed consent obtained.  Time-out conducted.  Noted no overlying erythema, induration, or other signs of local infection.  Skin prepped in a sterile fashion.  Local anesthesia: Topical Ethyl chloride.  With sterile technique and under real time ultrasound guidance:  Noted a fluctuant structure around the callus, a 25-gauge needle was advanced and 1/2 mL kenalog 40, 1/2 mL lidocaine injected easily. Completed without difficulty  Pain immediately resolved suggesting accurate placement of the medication.  Advised to call if fevers/chills, erythema, induration, drainage, or persistent bleeding.  Images permanently stored and available for review in the ultrasound unit.  Impression: Technically successful ultrasound guided injection.  Impression and Recommendations:    Callus, left great toe Tender, swollen, inflamed callus on the left great toe. Injected under ultrasound guidance. Return to see me in one month. I have also added a dancers pad.

## 2016-05-16 ENCOUNTER — Ambulatory Visit (INDEPENDENT_AMBULATORY_CARE_PROVIDER_SITE_OTHER): Payer: BLUE CROSS/BLUE SHIELD | Admitting: Sports Medicine

## 2016-05-16 ENCOUNTER — Encounter: Payer: Self-pay | Admitting: Sports Medicine

## 2016-05-16 DIAGNOSIS — L84 Corns and callosities: Secondary | ICD-10-CM | POA: Diagnosis not present

## 2016-05-16 DIAGNOSIS — L600 Ingrowing nail: Secondary | ICD-10-CM | POA: Diagnosis not present

## 2016-05-16 MED ORDER — HYDROCODONE-ACETAMINOPHEN 10-325 MG PO TABS: 1.0000 | ORAL_TABLET | Freq: Three times a day (TID) | ORAL | 0 refills | Status: DC | PRN

## 2016-05-16 NOTE — Assessment & Plan Note (Signed)
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 These nail plates were removed and treated with phenol. Hydrocodone for pain. Return to see me in 2 weeks.

## 2016-05-16 NOTE — Assessment & Plan Note (Signed)
Referral back to podiatry. Injection was only minimally effective.

## 2016-05-16 NOTE — Progress Notes (Signed)
  Subjective:    CC: Follow-up  HPI: Callus: Did not respond very much to injection. He agrees to rediscuss this with podiatry.  Ingrown toenails: Right-sided, second and first toes. Desires excision today. Symptoms are moderate, persistent. No infection, minimal pain. It tends to catch on things.  Past medical history:  Negative.  See flowsheet/record as well for more information.  Surgical history: Negative.  See flowsheet/record as well for more information.  Family history: Negative.  See flowsheet/record as well for more information.  Social history: Negative.  See flowsheet/record as well for more information.  Allergies, and medications have been entered into the medical record, reviewed, and no changes needed.   Review of Systems: No fevers, chills, night sweats, weight loss, chest pain, or shortness of breath.   Objective:    General: Well Developed, well nourished, and in no acute distress.  Neuro: Alert and oriented x3, extra-ocular muscles intact, sensation grossly intact.  HEENT: Normocephalic, atraumatic, pupils equal round reactive to light, neck supple, no masses, no lymphadenopathy, thyroid nonpalpable.  Skin: Warm and dry, no rashes. Cardiac: Regular rate and rhythm, no murmurs rubs or gallops, no lower extremity edema.  Respiratory: Clear to auscultation bilaterally. Not using accessory muscles, speaking in full sentences. Right Foot: No visible erythema or swelling. Range of motion is full in all directions. Strength is 5/5 in all directions. No hallux valgus. No pes cavus or pes planus. No abnormal callus noted. No pain over the navicular prominence, or base of fifth metatarsal. No tenderness to palpation of the calcaneal insertion of plantar fascia. No pain at the Achilles insertion. No pain over the calcaneal bursa. No pain of the retrocalcaneal bursa. No tenderness to palpation over the tarsals, metatarsals, or phalanges. No hallux rigidus or limitus. No  tenderness palpation over interphalangeal joints. No pain with compression of the metatarsal heads. Neurovascularly intact distally. Onychodystrophy of the great and second toenails  Procedure:  Removal of right great and right second nail plates Risks, benefits, alternatives explained to patient. Consent obtained. Time out conducted. Noted no overlying induration or erythema at site of injection. Toe cleaned with alcohol, then a total of 20cc lidocaine 2% infiltrated at adjacent webspaces at the location of the bifurcation of the common digital nerve to proper digital nerves.  Some lidocaine also infiltrated at hyponychium and under nail bed.  Adequate anesthesia ensured. Toe prepped and draped in a sterile fashion. Nail elevator used to separate nail plate from nail bed. Hemostat then used to separate nail fragment from surrounding structures. Nail bed and matrix treated. Minor bleeding controlled with pressure and phenol. Antibiotic ointment applied. Toe dressed. Advised to return if increased redness, swelling, drainage, fevers, or chills.  Impression and Recommendations:    Callus, left great toe Referral back to podiatry. Injection was only minimally effective.   Ingrown toenail Onychodystrophy on the right first and second toenails. These nail plates were removed and treated with phenol. Hydrocodone for pain. Return to see me in 2 weeks.  I spent 25 minutes with this patient, greater than 50% was face-to-face time counseling regarding the above diagnoses, this was separate from the time spent performing the above procedure

## 2016-05-19 ENCOUNTER — Ambulatory Visit: Payer: Self-pay | Admitting: Sports Medicine

## 2016-05-23 ENCOUNTER — Telehealth: Payer: Self-pay | Admitting: Sports Medicine

## 2016-05-23 DIAGNOSIS — L600 Ingrowing nail: Secondary | ICD-10-CM

## 2016-05-23 MED ORDER — HYDROCODONE-ACETAMINOPHEN 10-325 MG PO TABS: 1.0000 | ORAL_TABLET | Freq: Three times a day (TID) | ORAL | 0 refills | Status: DC | PRN

## 2016-05-23 NOTE — Telephone Encounter (Signed)
Leonard Gilbert is having increasing pain at his left toenail, I ran into him at the gas station. He is asking for a bit more hydrocodone.

## 2016-05-30 ENCOUNTER — Ambulatory Visit: Payer: BLUE CROSS/BLUE SHIELD | Admitting: Sports Medicine

## 2016-12-09 ENCOUNTER — Ambulatory Visit (INDEPENDENT_AMBULATORY_CARE_PROVIDER_SITE_OTHER): Payer: BLUE CROSS/BLUE SHIELD | Admitting: Sports Medicine

## 2016-12-09 ENCOUNTER — Telehealth: Payer: Self-pay | Admitting: Sports Medicine

## 2016-12-09 DIAGNOSIS — R109 Unspecified abdominal pain: Secondary | ICD-10-CM

## 2016-12-09 DIAGNOSIS — R10A2 Flank pain, left side: Secondary | ICD-10-CM

## 2016-12-09 DIAGNOSIS — F329 Major depressive disorder, single episode, unspecified: Secondary | ICD-10-CM

## 2016-12-09 DIAGNOSIS — L600 Ingrowing nail: Secondary | ICD-10-CM

## 2016-12-09 DIAGNOSIS — F32A Depression, unspecified: Secondary | ICD-10-CM | POA: Insufficient documentation

## 2016-12-09 DIAGNOSIS — F419 Anxiety disorder, unspecified: Secondary | ICD-10-CM

## 2016-12-09 MED ORDER — SERTRALINE HCL 25 MG PO TABS: 25.0000 mg | ORAL_TABLET | Freq: Every day | ORAL | 2 refills | Status: DC

## 2016-12-09 MED ORDER — HYDROCODONE-ACETAMINOPHEN 10-325 MG PO TABS: 1.0000 | ORAL_TABLET | Freq: Three times a day (TID) | ORAL | 0 refills | Status: DC | PRN

## 2016-12-09 NOTE — Progress Notes (Signed)
  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 At this point he is not suicidal homicidal but is agreeable to try pharmacotherapy.  Past medical history:  Negative.  See flowsheet/record as well for more information.  Surgical history: Negative.  See flowsheet/record as well for more information.  Family history: Negative.  See flowsheet/record as well for more information.  Social history: Negative.  See flowsheet/record as well for more information.  Allergies, and medications have been entered into the medical record, reviewed, and no changes needed.   Review of Systems: No fevers, chills, night sweats, weight loss, chest pain, or shortness of breath.   Objective:    General: Well Developed, well nourished, and in no acute distress.  Neuro: Alert and oriented x3, extra-ocular muscles intact, sensation grossly intact.  HEENT: Normocephalic, atraumatic, pupils equal round reactive to light, neck supple, no masses, no lymphadenopathy, thyroid nonpalpable.  Skin: Warm and dry, no rashes. Cardiac: Regular rate and rhythm, no murmurs rubs or gallops, no lower extremity edema.  Respiratory: Clear to auscultation bilaterally. Not using accessory muscles, speaking in full sentences.  Impression and Recommendations:    Anxiety and depression With high PHQ9 and GAD7 scores, starting low-dose sertraline, 25 mg daily. Return in one month for repeat PHQ9 and GAD7.  I spent 25 minutes with this patient, greater than 50% was face-to-face time counseling regarding the above diagnoses

## 2016-12-09 NOTE — Assessment & Plan Note (Signed)
With high PHQ9 and GAD7 scores, starting low-dose sertraline, 25 mg daily. Return in one month for repeat PHQ9 and GAD7.

## 2016-12-09 NOTE — Telephone Encounter (Signed)
Leonard Gilbert is coming in 2 weeks for nail plate removal, having pain. No sign infection.  Short course hydrocodone given.

## 2016-12-13 ENCOUNTER — Ambulatory Visit (INDEPENDENT_AMBULATORY_CARE_PROVIDER_SITE_OTHER): Payer: Self-pay

## 2016-12-13 ENCOUNTER — Ambulatory Visit (INDEPENDENT_AMBULATORY_CARE_PROVIDER_SITE_OTHER): Payer: Self-pay | Admitting: Sports Medicine

## 2016-12-13 ENCOUNTER — Encounter: Payer: Self-pay | Admitting: Sports Medicine

## 2016-12-13 VITALS — BP 149/99 | HR 55 | Wt 145.0 lb

## 2016-12-13 DIAGNOSIS — R10A2 Flank pain, left side: Secondary | ICD-10-CM | POA: Insufficient documentation

## 2016-12-13 DIAGNOSIS — R109 Unspecified abdominal pain: Secondary | ICD-10-CM

## 2016-12-13 DIAGNOSIS — R319 Hematuria, unspecified: Secondary | ICD-10-CM

## 2016-12-13 LAB — POCT URINALYSIS DIPSTICK
Glucose, UA: NEGATIVE
Leukocytes, UA: NEGATIVE
Nitrite, UA: NEGATIVE
Protein, UA: 30
Spec Grav, UA: >=1.03 — AB (ref 1.010–1.025)
Urobilinogen, UA: 0.2 U/dL
pH, UA: 5.5 (ref 5.0–8.0)

## 2016-12-13 MED ORDER — CIPROFLOXACIN HCL 750 MG PO TABS: 750.0000 mg | ORAL_TABLET | Freq: Two times a day (BID) | ORAL | 0 refills | Status: AC

## 2016-12-13 NOTE — Progress Notes (Signed)
  Subjective:    CC: Left flank pain  HPI: For the past several days this pleasant 41 year old male has had severe left flank pain with radiation down the groin, urinalysis today showed hematuria. No fevers, chills, nausea, vomiting, diarrhea. Symptoms are moderate, persistent.  Past medical history:  Negative.  See flowsheet/record as well for more information.  Surgical history: Negative.  See flowsheet/record as well for more information.  Family history: Negative.  See flowsheet/record as well for more information.  Social history: Negative.  See flowsheet/record as well for more information.  Allergies, and medications have been entered into the medical record, reviewed, and no changes needed.   Review of Systems: No fevers, chills, night sweats, weight loss, chest pain, or shortness of breath.   Objective:    General: Well Developed, well nourished, and in no acute distress.  Neuro: Alert and oriented x3, extra-ocular muscles intact, sensation grossly intact.  HEENT: Normocephalic, atraumatic, pupils equal round reactive to light, neck supple, no masses, no lymphadenopathy, thyroid nonpalpable.  Skin: Warm and dry, no rashes. Cardiac: Regular rate and rhythm, no murmurs rubs or gallops, no lower extremity edema.  Respiratory: Clear to auscultation bilaterally. Not using accessory muscles, speaking in full sentences. Abdominal: Soft, nontender, nondistended, no bowel sounds, no palpable masses, left costovertebral ankle pain.  Urinalysis is positive for blood, no leukocytes or nitrites.  Renal protocol CT is negative for any evidence of nephrolithiasis.  Impression and Recommendations:    Acute left flank pain With hematuria, stat renal per call CT. Awaiting urine culture, treatment will depend on CT results.  I spent 25 minutes with this patient, greater than 50% was face-to-face time counseling regarding the above diagnoses

## 2016-12-13 NOTE — Assessment & Plan Note (Signed)
CT is negative, this likely represents bacterial prostatitis, adding Cipro for 2 weeks.

## 2016-12-13 NOTE — Assessment & Plan Note (Signed)
With hematuria, stat renal per call CT. Awaiting urine culture, treatment will depend on CT results.

## 2016-12-13 NOTE — Addendum Note (Signed)
Addended by: Monica BectonHEKKEKANDAM, Johnye Kist J on: 12/13/2016 04:36 PM   Modules accepted: Orders

## 2016-12-14 LAB — URINE CULTURE: Organism ID, Bacteria: NO GROWTH

## 2016-12-21 ENCOUNTER — Encounter: Payer: Self-pay | Admitting: Sports Medicine

## 2016-12-21 ENCOUNTER — Ambulatory Visit (INDEPENDENT_AMBULATORY_CARE_PROVIDER_SITE_OTHER): Payer: Self-pay | Admitting: Sports Medicine

## 2016-12-21 VITALS — BP 123/85 | HR 64 | Resp 16 | Wt 145.3 lb

## 2016-12-21 DIAGNOSIS — L600 Ingrowing nail: Secondary | ICD-10-CM

## 2016-12-21 MED ORDER — HYDROCODONE-ACETAMINOPHEN 10-325 MG PO TABS: 1.0000 | ORAL_TABLET | Freq: Three times a day (TID) | ORAL | 0 refills | Status: DC | PRN

## 2016-12-21 NOTE — Assessment & Plan Note (Signed)
Removal of entire left great nail plate. Treatment with phenol. Hydrocodone for postprocedural pain.

## 2016-12-21 NOTE — Progress Notes (Signed)
   Procedure:  Removal of left great toenail. Risks, benefits, alternatives explained to patient. Consent obtained. Time out conducted. Noted no overlying induration or erythema at site of injection. Toe cleaned with alcohol, then a total of 10cc lidocaine 2% infiltrated at adjacent webspaces at the location of the bifurcation of the common digital nerve to proper digital nerves.  Some lidocaine also infiltrated at hyponychium and under nail bed.  Adequate anesthesia ensured. Toe prepped and draped in a sterile fashion. Nail elevator used to separate nail plate from nail bed. Hemostat then used to remove entire nail plate. Nail bed and matrix treated. Minor bleeding controlled with pressure and phenol. Antibiotic ointment applied. Toe dressed. Advised to return if increased redness, swelling, drainage, fevers, or chills.

## 2016-12-21 NOTE — Addendum Note (Signed)
Addended by: Monica BectonHEKKEKANDAM, THOMAS J on: 12/21/2016 09:51 AM   Modules accepted: Orders

## 2017-07-05 ENCOUNTER — Ambulatory Visit (INDEPENDENT_AMBULATORY_CARE_PROVIDER_SITE_OTHER): Payer: Self-pay

## 2017-07-05 ENCOUNTER — Ambulatory Visit (INDEPENDENT_AMBULATORY_CARE_PROVIDER_SITE_OTHER): Payer: Self-pay | Admitting: Osteopathic Medicine

## 2017-07-05 ENCOUNTER — Encounter: Payer: Self-pay | Admitting: Osteopathic Medicine

## 2017-07-05 VITALS — BP 124/86 | HR 67 | Temp 97.7°F | Wt 142.0 lb

## 2017-07-05 DIAGNOSIS — R0989 Other specified symptoms and signs involving the circulatory and respiratory systems: Secondary | ICD-10-CM

## 2017-07-05 DIAGNOSIS — R509 Fever, unspecified: Secondary | ICD-10-CM

## 2017-07-05 DIAGNOSIS — R059 Cough, unspecified: Secondary | ICD-10-CM

## 2017-07-05 DIAGNOSIS — R05 Cough: Secondary | ICD-10-CM

## 2017-07-05 DIAGNOSIS — J209 Acute bronchitis, unspecified: Secondary | ICD-10-CM

## 2017-07-05 MED ORDER — AZITHROMYCIN 250 MG PO TABS: ORAL_TABLET | ORAL | 0 refills | Status: DC

## 2017-07-05 MED ORDER — GUAIFENESIN-CODEINE 100-10 MG/5ML PO SOLN: 5.0000 mL | Freq: Four times a day (QID) | ORAL | 0 refills | Status: DC | PRN

## 2017-07-05 MED ORDER — PREDNISONE 20 MG PO TABS: 20.0000 mg | ORAL_TABLET | Freq: Two times a day (BID) | ORAL | 0 refills | Status: DC

## 2017-07-05 NOTE — Patient Instructions (Addendum)
Plan:  Given smoking history and given how the lungs are sounding on exam without any pneumonia seen on x-ray, I am going to go ahead and treat this as a likely COPD exacerbation. Typically, we will treat with steroid burst (prednisone), cough medicine (guaifenesin-codeine syrup, inhaled medications (inhaler sample), and antibiotics (azithromycin) if you get worse.   To definitively diagnose COPD requires some lung function testing and a few other things, or we can of course always encourage patient to quit smoking and consider just starting an inhaler to help with symptoms. Would discuss this further with your primary doctor when you see Dr. TKarie Schwalbe

## 2017-07-05 NOTE — Progress Notes (Signed)
HPI: Leonard Gilbert is a 41 y.o. male who  has no past medical history on file.  [facility], 07/05/17,  for chief complaint of:  Chief Complaint  Patient presents with  . Cough    Since Saturday been feeling sick, now Wednesday, Productive cough is most bothersome symptom. Reports low appetite and wt loss w/ this illness. Has been using son's inhaler d/t trouble breathing. Nasal congestion.  Last took OTC meds yesterday, no fever now.   HTN: BP high on intake. No Hx HTN or antihypertensive Rx. BP better prior to leaving the office.    There is no immunization history on file for this patient.    Past medical, surgical, social and family history reviewed:  Patient Active Problem List   Diagnosis Date Noted  . Acute left flank pain 12/13/2016  . Anxiety and depression 12/09/2016  . 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 Hallux limitus, acquired 02/22/2016  . Callus, left great toe 02/22/2016  . Ingrown toenail 09/20/2013    Past Surgical History:  Procedure Laterality Date  . HERNIA REPAIR      Social History   Tobacco Use  . Smoking status: Current Every Day Smoker    Packs/day: 1.00    Years: 25.00    Pack years: 25.00  . Smokeless tobacco: Never Used  Substance Use Topics  . Alcohol use: Yes    Family History  Problem Relation Age of Onset  . Cancer Mother        lung  . Heart failure Mother   . Diabetes Mother   . Cancer Father        lung  . Heart failure Father   . Diabetes Father      Current medication list and allergy/intolerance information reviewed:    Current Outpatient Medications  Medication Sig Dispense Refill  . HYDROcodone-acetaminophen (NORCO) 10-325 MG tablet Take 1 tablet by mouth every 8 (eight) hours as needed. 30 tablet 0  . sertraline (ZOLOFT) 25 MG tablet Take 1 tablet (25 mg total) by mouth daily. 30 tablet 2   No current facility-administered medications for this visit.      No Known Allergies    Review of Systems:  Constitutional:  +fever few days ago, no chills, +recent illness, +unintentional weight changes. +significant fatigue.   HEENT: +headache, no vision change, no hearing change, No sore throat, +sinus pressure  Cardiac: No  chest pain, No  pressure, No palpitations  Respiratory:  + shortness of breath. +Cough  Gastrointestinal: No  abdominal pain, No  nausea  Musculoskeletal: No new myalgia/arthralgia   Exam:  BP 124/86 (BP Location: Right Arm)   Pulse 67   Temp 97.7 F (36.5 C) (Oral)   Wt 142 lb 0.6 oz (64.4 kg)   BMI 20.38 kg/m   Constitutional: VS see above. General Appearance: alert, well-developed, well-nourished, NAD  Eyes: Normal lids and conjunctive, non-icteric sclera  Ears, Nose, Mouth, Throat: MMM, Normal external inspection ears/nares/mouth/lips/gums. Pharynx/tonsils no erythema, no exudate. Nasal mucosa normal.   Neck: No masses, trachea midline. No thyroid enlargement. No tenderness/mass appreciated. No lymphadenopathy  Respiratory: Normal respiratory effort. Coarse breath sounds and slight expiratory wheeze b/l all lung fields.   Cardiovascular: S1/S2 normal, no murmur, no rub/gallop auscultated. RRR.    Dg Chest 2 View  Result Date: 07/05/2017 CLINICAL DATA:  Cough, chest congestion, and fever for the past 4 days. History of previous episodes of pneumonia. Patient is a current smoker. EXAM:  CHEST  2 VIEW COMPARISON:  None in PACs FINDINGS: The lungs are adequately inflated. There is an approximately 3 mm diameter calcified nodule in subpleural location on the right laterally. There is no alveolar infiltrate. There is no pleural effusion. There is mild hemidiaphragm flattening. The heart and pulmonary vascularity are normal. IMPRESSION: Chronic bronchitic-smoking related changes. Evidence of previous granulomatous infection. No acute pneumonia. Electronically Signed   By: David  SwazilandJordan M.D.   On: 07/05/2017  10:08     ASSESSMENT/PLAN: The primary encounter diagnosis was Bronchitis, acute, with bronchospasm. Diagnoses of Cough and Abnormal lung sounds were also pertinent to this visit.  Meds ordered this encounter  Medications  . predniSONE (DELTASONE) 20 MG tablet    Sig: Take 1 tablet (20 mg total) by mouth 2 (two) times daily with a meal.    Dispense:  10 tablet    Refill:  0  . guaiFENesin-codeine 100-10 MG/5ML syrup    Sig: Take 5-10 mLs by mouth every 6 (six) hours as needed for cough.    Dispense:  180 mL    Refill:  0  . azithromycin (ZITHROMAX) 250 MG tablet    Sig: 2 tabs po on Day 1, then 1 tab daily Days 2 - 5    Dispense:  6 tablet    Refill:  0      Patient Instructions  Plan:  Given smoking history and given how the lungs are sounding on exam without any pneumonia seen on x-ray, I am going to go ahead and treat this as a likely COPD exacerbation. Typically, we will treat with steroid burst (prednisone), cough medicine (guaifenesin-codeine syrup, inhaled medications (inhaler sample), and antibiotics (azithromycin) if you get worse.   To definitively diagnose COPD requires some lung function testing and a few other things, or we can of course always encourage patient to quit smoking and consider just starting an inhaler to help with symptoms. Would discuss this further with your primary doctor when you see Dr. Karie Schwalbe.      Visit summary with medication list and pertinent instructions was printed for patient to review. All questions at time of visit were answered - patient instructed to contact office with any additional concerns. ER/RTC precautions were reviewed with the patient.   Follow-up plan: Return for recheck as directed with Dr. Karie Schwalbe. or sooner/as needed .  Note: Total time spent 25 minutes, greater than 50% of the visit was spent face-to-face counseling and coordinating care for the following: The primary encounter diagnosis was Bronchitis, acute, with bronchospasm.  Diagnoses of Cough and Abnormal lung sounds were also pertinent to this visit.Marland Kitchen.  Please note: voice recognition software was used to produce this document, and typos may escape review. Please contact Dr. Lyn HollingsheadAlexander for any needed clarifications.

## 2017-09-06 ENCOUNTER — Other Ambulatory Visit: Payer: Self-pay

## 2017-09-06 ENCOUNTER — Encounter: Payer: Self-pay | Admitting: *Deleted

## 2017-09-06 ENCOUNTER — Emergency Department (INDEPENDENT_AMBULATORY_CARE_PROVIDER_SITE_OTHER)
Admission: EM | Admit: 2017-09-06 | Discharge: 2017-09-06 | Disposition: A | Discharge status: Home / Self Care | Payer: Self-pay | Source: Home / Self Care | Attending: Emergency Medicine | Admitting: Emergency Medicine

## 2017-09-06 ENCOUNTER — Emergency Department (INDEPENDENT_AMBULATORY_CARE_PROVIDER_SITE_OTHER): Payer: Self-pay

## 2017-09-06 DIAGNOSIS — F411 Generalized anxiety disorder: Secondary | ICD-10-CM

## 2017-09-06 DIAGNOSIS — R0789 Other chest pain: Secondary | ICD-10-CM

## 2017-09-06 MED ORDER — ALPRAZOLAM 0.25 MG PO TABS: ORAL_TABLET | ORAL | 0 refills | Status: DC

## 2017-09-06 NOTE — ED Provider Notes (Signed)
[facility]    CSN: [id number] Arrival date & time: 09/06/17  1059     History   Chief Complaint No chief complaint on file. Chief complaint: Chest pain  HPI Marlene Bastaul Tusing is a 42 y.o. male.  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 The history is provided by the patient.  Chest Pain  Pain location:  L lateral chest Pain quality: aching and sharp   Pain quality comment:  Pleuritic, feels he needs to sigh and take a deep breath frequently. Pain radiates to:  Does not radiate Pain severity:  Mild Timing:  Intermittent Progression:  Waxing and waning Chronicity:  New Context: stress   Context: not eating and not trauma   Relieved by: Relaxing. Exacerbated by: Being in stressful situation. Associated symptoms: anxiety and fatigue   Associated symptoms: no abdominal pain, no AICD problem, no back pain, no claudication, no cough, no diaphoresis, no dizziness, no dysphagia, no fever, no headache, no heartburn, no lower extremity edema, no nausea, no near-syncope, no palpitations, no shortness of breath and no vomiting   Risk factors: smoking   Risk factors: no coronary artery disease and no diabetes mellitus    He noticed the pain 5 days ago at a very stressful work day, and the pain resolved over the weekend when he was able to relax.  Then today, extremely stressful busy work day.  And the above chest pain recurred.  Not associated with physical exertion.  In the past, his PCP diagnosed anxiety and depression and prescribed Zoloft, which he had the prescription filled, but patient admits that he never started taking the Zoloft and still has the prescription bottle at home. He denies frank dysphoria or depression or any suicidal or homicidal ideation, but admits to occasional feelings of dysphoria when he is over stressed. History reviewed. No pertinent past medical history.  Patient Active Problem List   Diagnosis Date Noted  . Acute left flank pain 12/13/2016    . Anxiety and depression 12/09/2016  . 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 Hallux limitus, acquired 02/22/2016  . Callus, left great toe 02/22/2016  . Ingrown toenail 09/20/2013    Past Surgical History:  Procedure Laterality Date  . HERNIA REPAIR         Home Medications    Prior to Admission medications   Medication Sig Start Date End Date Taking? Authorizing Provider  ALPRAZolam Prudy Feeler(XANAX) 0.25 MG tablet Take 1 twice a day as needed for anxiety. 09/06/17   Lajean ManesMassey, David, MD    Family History Family History  Problem Relation Age of Onset  . Cancer Father        lung  . Heart failure Father   . Diabetes Father   . Cancer Mother        lung  . Heart failure Mother   . Diabetes Mother     Social History Social History   Tobacco Use  . Smoking status: Current Every Day Smoker    Packs/day: 1.00    Years: 25.00    Pack years: 25.00  . Smokeless tobacco: Never Used  Substance Use Topics  . Alcohol use: Yes  . Drug use: No     Allergies   Patient has no known allergies.   Review of Systems Review of Systems  Constitutional: Positive for fatigue. Negative for diaphoresis and fever.  HENT: Negative for trouble swallowing.   Respiratory: Negative for cough and shortness of breath.   Cardiovascular: Positive for chest pain. Negative for  palpitations, claudication and near-syncope.  Gastrointestinal: Negative for abdominal pain, heartburn, nausea and vomiting.  Musculoskeletal: Negative for back pain.  Neurological: Negative for dizziness and headaches.  All other systems reviewed and are negative.    Physical Exam Triage Vital Signs ED Triage Vitals  Enc Vitals Group     BP 09/06/17 1119 (!) 168/95     Pulse Rate 09/06/17 1119 62     Resp 09/06/17 1119 18     Temp 09/06/17 1119 98.2 F (36.8 C)     Temp Source 09/06/17 1119 Oral     SpO2 09/06/17 1119 98 %     Weight --      Height --      Head Circumference --      Peak Flow --      Pain Score  09/06/17 1120 5     Pain Loc --      Pain Edu? --      Excl. in GC? --    No data found.  Updated Vital Signs BP (!) 168/95 (BP Location: Right Arm)   Pulse 62   Temp 98.2 F (36.8 C) (Oral)   Resp 18   SpO2 98%    Physical Exam  Constitutional: He is oriented to person, place, and time. He appears well-developed and well-nourished. No distress.  Pleasant male.  Mildly anxious.  No acute distress.  HENT:  Head: Normocephalic and atraumatic.  Mouth/Throat: Oropharynx is clear and moist.  Eyes: Pupils are equal, round, and reactive to light. No scleral icterus.  Neck: Normal range of motion. Neck supple. No JVD present. No tracheal deviation present.  Cardiovascular: Normal rate, regular rhythm and normal heart sounds. Exam reveals no gallop and no friction rub.  No murmur heard. Pulmonary/Chest: Effort normal and breath sounds normal.  Abdominal: Soft. He exhibits no distension.  Musculoskeletal: He exhibits no edema.  Neurological: He is alert and oriented to person, place, and time. No cranial nerve deficit.  Skin: Skin is warm and dry. Capillary refill takes less than 2 seconds.  Psychiatric: His behavior is normal.  Vitals reviewed. Mood: Mildly dysphoric.  Mildly anxious.  Thought content and speech normal.  No suicidal or homicidal ideation.  Oxygen saturation 98% on room air. UC Treatments / Results  Labs (all labs ordered are listed, but only abnormal results are displayed) Labs Reviewed - No data to display  EKG  EKG Interpretation None       Radiology Dg Chest 2 View  Result Date: 09/06/2017 CLINICAL DATA:  Left-sided chest pain. EXAM: CHEST  2 VIEW COMPARISON:  07/05/2017 FINDINGS: The heart size and mediastinal contours are within normal limits. Both lungs are clear except for chronic small calcified granuloma laterally in the right midzone. No effusions. The visualized skeletal structures are unremarkable. IMPRESSION: No active cardiopulmonary disease.  Electronically Signed   By: Francene Boyers M.D.   On: 09/06/2017 11:26    Procedures Procedures (including critical care time)  Medications Ordered in UC Medications - No data to display   Initial Impression / Assessment and Plan / UC Course  I have reviewed the triage vital signs and the nursing notes.  Pertinent labs & imaging results that were available during my care of the patient were reviewed by me and considered in my medical decision making (see chart for details).    EKG today: Normal sinus rhythm, no ST or T wave abnormality.  No ectopy.  No acute abnormalities, when compared to prior EKG of 11/20/2012  Final Clinical Impressions(s) / UC Diagnoses   Final diagnoses:  Chest pain, atypical  Anxiety state  No evidence for acute cardiorespiratory event, based on history, physical exam and EKG and chest x-ray.  Likely stress and anxiety as a cause for his symptoms. Treatment options discussed, as well as risks, benefits, alternatives. Patient voiced understanding and agreement with the following plans:  An After Visit Summary was printed and given to the patient. "Today, chest x-ray and EKG are within normal limits. No evidence for any heart or lung cause for your chest pain. Anxiety is the likely cause for your symptoms. I advise yo uto take off from work today and tomorrow. Today, start taking the Zoloft as prescribed by Dr. [name].  Give it some time to start working. For short-term anxiety symptoms, take Xanax as prescribed today.-This is for short-term use only. Follow-up with Dr. [name] within 1 wk to recheck symptoms and recheck blood pressure (mildly elevated today) If any severe or worsening symptoms, go to emergency room."   Rx: ALPRAZolam (XANAX) 0.25 MG tablet    Dispense Quantity: 15  Refills: 0         Sig: Take 1 twice a day as needed for anxiety.      Precautions discussed. Red flags discussed. Questions invited and answered. Patient  voiced understanding and agreement.    Lajean Manes, MD 09/06/17 2142

## 2017-09-06 NOTE — Discharge Instructions (Signed)
Today, chest x-ray and EKG are within normal limits. No evidence for any heart or lung cause for your chest pain. Anxiety is the likely cause for your symptoms. I advise youto take off from work today and tomorrow. Today, start taking the Zoloft as prescribed by Dr. Benjamin Stainhekkekandam.  Give it some time to start working. For short-term anxiety symptoms, take Xanax as prescribed today. Follow-up with Dr. Benjamin Stainhekkekandam within 1 wk to recheck symptoms and recheck blood pressure (mildly elevated today) If any severe or worsening symptoms, go to emergency room.

## 2017-09-06 NOTE — ED Triage Notes (Signed)
Pt c/o LT sided CP that began 5 days ago, resolved over the weekend and returned again this morning. Denies any other s/s.

## 2017-09-14 ENCOUNTER — Encounter: Payer: Self-pay | Admitting: Sports Medicine

## 2017-09-14 ENCOUNTER — Ambulatory Visit (INDEPENDENT_AMBULATORY_CARE_PROVIDER_SITE_OTHER): Payer: Self-pay | Admitting: Sports Medicine

## 2017-09-14 DIAGNOSIS — F329 Major depressive disorder, single episode, unspecified: Secondary | ICD-10-CM

## 2017-09-14 DIAGNOSIS — L600 Ingrowing nail: Secondary | ICD-10-CM

## 2017-09-14 DIAGNOSIS — F32A Depression, unspecified: Secondary | ICD-10-CM

## 2017-09-14 DIAGNOSIS — M79672 Pain in left foot: Secondary | ICD-10-CM

## 2017-09-14 DIAGNOSIS — F419 Anxiety disorder, unspecified: Secondary | ICD-10-CM

## 2017-09-14 MED ORDER — ALPRAZOLAM 0.25 MG PO TABS: 0.2500 mg | ORAL_TABLET | Freq: Every day | ORAL | 3 refills | Status: DC | PRN

## 2017-09-14 NOTE — Assessment & Plan Note (Signed)
Localized under the plantar arch, worse with weightbearing, worse in the morning, no pain at the plantar fascia origin, this is more at the plantar navicular. Return for custom molded orthotics.

## 2017-09-14 NOTE — Progress Notes (Signed)
Subjective:    CC: Follow-up  HPI: Anxiety: Mostly job-related stress, does get episodes of panic, he was placed on alprazolam 0.25 mg by Dr. Georgina PillionMassey, when taken in the mornings on work days this seems to work well.  He agrees to keep it at no more than 1 pill daily over a month.  Foot pain: Left-sided, localized on the plantar aspect of the arch, worse in the morning and with prolonged weightbearing, no custom orthotics yet.  Ingrown toenails: Long time ago we did a bilateral nail plate excision with phenol treatment, this was successful with the exception of some residual nail growth at the medial and lateral nail folds suggesting incomplete nail matricectomy.  Minimally painful.  I reviewed the past medical history, family history, social history, surgical history, and allergies today and no changes were needed.  Please see the problem list section below in epic for further details.  Past Medical History: No past medical history on file. Past Surgical History: Past Surgical History:  Procedure Laterality Date  . HERNIA REPAIR     Social History: Social History   Socioeconomic History  . Marital status: Legally Separated    Spouse name: None  . Number of children: None  . Years of education: None  . Highest education level: None  Social Needs  . Financial resource strain: None  . Food insecurity - worry: None  . Food insecurity - inability: None  . Transportation needs - medical: None  . Transportation needs - non-medical: None  Occupational History  . None  Tobacco Use  . Smoking status: Current Every Day Smoker    Packs/day: 1.00    Years: 25.00    Pack years: 25.00  . Smokeless tobacco: Never Used  Substance and Sexual Activity  . Alcohol use: Yes  . Drug use: No  . Sexual activity: None  Other Topics Concern  . None  Social History Narrative  . None   Family History: Family History  Problem Relation Age of Onset  . Cancer Father        lung  . Heart  failure Father   . Diabetes Father   . Cancer Mother        lung  . Heart failure Mother   . Diabetes Mother    Allergies: No Known Allergies Medications: See med rec.  Review of Systems: No fevers, chills, night sweats, weight loss, chest pain, or shortness of breath.   Objective:    General: Well Developed, well nourished, and in no acute distress.  Neuro: Alert and oriented x3, extra-ocular muscles intact, sensation grossly intact.  HEENT: Normocephalic, atraumatic, pupils equal round reactive to light, neck supple, no masses, no lymphadenopathy, thyroid nonpalpable.  Skin: Warm and dry, no rashes. Cardiac: Regular rate and rhythm, no murmurs rubs or gallops, no lower extremity edema.  Respiratory: Clear to auscultation bilaterally. Not using accessory muscles, speaking in full sentences. Bilateral feet: No visible erythema or swelling. Range of motion is full in all directions. Strength is 5/5 in all directions. No hallux valgus. No pes cavus or pes planus. No abnormal callus noted. No pain over the navicular prominence, or base of fifth metatarsal.  Tender to palpation on the plantar aspect of the navicular prominence on the left. No tenderness to palpation of the calcaneal insertion of plantar fascia. No pain at the Achilles insertion. No pain over the calcaneal bursa. No pain of the retrocalcaneal bursa. No tenderness to palpation over the tarsals, metatarsals, or phalanges. No hallux rigidus  or limitus. No tenderness palpation over interphalangeal joints. No pain with compression of the metatarsal heads. Neurovascularly intact distally. There is residual medial and lateral nail plate growth on both great toenails suggesting incomplete matricectomy when we did his nail plate excisions.  Impression and Recommendations:    Anxiety and depression Self discontinued sertraline 25. He was started on alprazolam 0.25 mg, he uses it before coming into work where all of his  stresses, does not endorse baseline generalized anxiety but more occupational related anxiety. He is functional at work, agrees not to use any more than 1/day. Follow-up in 1 month to see how things are going.  Left foot pain Localized under the plantar arch, worse with weightbearing, worse in the morning, no pain at the plantar fascia origin, this is more at the plantar navicular. Return for custom molded orthotics.  Ingrown toenail Has overall done well after his nail plate excision bilaterally on the great toenails, with phenol treatment, he does have a bit of residual nail growing on the medial and lateral nail folds, this will require repeat bilateral numbing, excision of the residual nail plate and retreatment with phenol. He will return in a separate visit for this. ___________________________________________ Leonard Gilbert. [name], M.D., ABFM., CAQSM. Primary Care and Sports Medicine [facility]  Adjunct Instructor of Family Medicine  [employer]

## 2017-09-14 NOTE — Assessment & Plan Note (Signed)
Self discontinued sertraline 25. He was started on alprazolam 0.25 mg, he uses it before coming into work where all of his stresses, does not endorse baseline generalized anxiety but more occupational related anxiety. He is functional at work, agrees not to use any more than 1/day. Follow-up in 1 month to see how things are going.

## 2017-09-14 NOTE — Assessment & Plan Note (Signed)
Has overall done well after his nail plate excision bilaterally on the great toenails, with phenol treatment, he does have a bit of residual nail growing on the medial and lateral nail folds, this will require repeat bilateral numbing, excision of the residual nail plate and retreatment with phenol. He will return in a separate visit for this.

## 2017-09-14 NOTE — Addendum Note (Signed)
Addended by: Monica BectonHEKKEKANDAM, Shields Pautz J on: 09/14/2017 04:02 PM   Modules accepted: Orders

## 2018-03-07 ENCOUNTER — Encounter: Payer: Self-pay | Admitting: Sports Medicine

## 2018-03-07 ENCOUNTER — Ambulatory Visit (INDEPENDENT_AMBULATORY_CARE_PROVIDER_SITE_OTHER): Payer: Self-pay | Admitting: Sports Medicine

## 2018-03-07 ENCOUNTER — Encounter (INDEPENDENT_AMBULATORY_CARE_PROVIDER_SITE_OTHER): Payer: Self-pay

## 2018-03-07 ENCOUNTER — Ambulatory Visit (INDEPENDENT_AMBULATORY_CARE_PROVIDER_SITE_OTHER): Payer: Self-pay

## 2018-03-07 DIAGNOSIS — M79671 Pain in right foot: Secondary | ICD-10-CM

## 2018-03-07 DIAGNOSIS — L603 Nail dystrophy: Secondary | ICD-10-CM

## 2018-03-07 DIAGNOSIS — L600 Ingrowing nail: Secondary | ICD-10-CM

## 2018-03-07 MED ORDER — HYDROCODONE-ACETAMINOPHEN 10-325 MG PO TABS: 1.0000 | ORAL_TABLET | Freq: Three times a day (TID) | ORAL | 0 refills | Status: DC | PRN

## 2018-03-07 NOTE — Assessment & Plan Note (Signed)
Removal of the left second and fifth toenails with phenol matricectomy. Return in 2 weeks for a wound check. Hydrocodone for postoperative pain.

## 2018-03-07 NOTE — Progress Notes (Signed)
Subjective:    CC: Toenail problems  HPI: Leonard Gilbert returns, he has painful, unsightly toenails on his left foot, second and fifth toes.  In addition he was running at the beach, took a misstep and now has pain, swelling over the dorsum of his right foot.  Moderate, persistent without radiation.  I reviewed the past medical history, family history, social history, surgical history, and allergies today and no changes were needed.  Please see the problem list section below in epic for further details.  Past Medical History: No past medical history on file. Past Surgical History: Past Surgical History:  Procedure Laterality Date  . HERNIA REPAIR     Social History: Social History   Socioeconomic History  . Marital status: Legally Separated    Spouse name: Not on file  . Number of children: Not on file  . Years of education: Not on file  . Highest education level: Not on file  Occupational History  . Not on file  Social Needs  . Financial resource strain: Not on file  . Food insecurity:    Worry: Not on file    Inability: Not on file  . Transportation needs:    Medical: Not on file    Non-medical: Not on file  Tobacco Use  . Smoking status: Current Every Day Smoker    Packs/day: 1.00    Years: 25.00    Pack years: 25.00  . Smokeless tobacco: Never Used  Substance and Sexual Activity  . Alcohol use: Yes  . Drug use: No  . Sexual activity: Not on file  Lifestyle  . Physical activity:    Days per week: Not on file    Minutes per session: Not on file  . Stress: Not on file  Relationships  . Social connections:    Talks on phone: Not on file    Gets together: Not on file    Attends religious service: Not on file    Active member of club or organization: Not on file    Attends meetings of clubs or organizations: Not on file    Relationship status: Not on file  Other Topics Concern  . Not on file  Social History Narrative  . Not on file   Family History: Family  History  Problem Relation Age of Onset  . Cancer Father        lung  . Heart failure Father   . Diabetes Father   . Cancer Mother        lung  . Heart failure Mother   . Diabetes Mother    Allergies: No Known Allergies Medications: See med rec.  Review of Systems: No fevers, chills, night sweats, weight loss, chest pain, or shortness of breath.   Objective:    General: Well Developed, well nourished, and in no acute distress.  Neuro: Alert and oriented x3, extra-ocular muscles intact, sensation grossly intact.  HEENT: Normocephalic, atraumatic, pupils equal round reactive to light, neck supple, no masses, no lymphadenopathy, thyroid nonpalpable.  Skin: Warm and dry, no rashes. Cardiac: Regular rate and rhythm, no murmurs rubs or gallops, no lower extremity edema.  Respiratory: Clear to auscultation bilaterally. Not using accessory muscles, speaking in full sentences. Bilateral feet: Left-sided onychodystrophy on the second and fifth toenails Range of motion is full in all directions. Strength is 5/5 in all directions. No hallux valgus. No pes cavus or pes planus. No abnormal callus noted. No pain over the navicular prominence, or base of fifth metatarsal. No tenderness  to palpation of the calcaneal insertion of plantar fascia. No pain at the Achilles insertion. No pain over the calcaneal bursa. No pain of the retrocalcaneal bursa. To palpation over the second metatarsal neck on the right with moderate swelling. No hallux rigidus or limitus. No tenderness palpation over interphalangeal joints. No pain with compression of the metatarsal heads. Neurovascularly intact distally.  Procedure:  Removal of left second toenail with phenol matricectomy of the nailbed. Risks, benefits, alternatives explained to patient. Consent obtained. Time out conducted. Noted no overlying induration or erythema at site of injection. Toe cleaned with alcohol, then a total of 5cc lidocaine 2%  infiltrated at adjacent webspaces at the location of the bifurcation of the common digital nerve to proper digital nerves.  Some lidocaine also infiltrated at hyponychium and under nail bed.  Adequate anesthesia ensured. Toe prepped and draped in a sterile fashion. Nail elevator used to separate nail plate from nail bed. Hemostat then used to separate nail fragment from surrounding structures. Nail bed and matrix treated. Minor bleeding controlled with pressure and phenol. Antibiotic ointment applied. Toe dressed. Advised to return if increased redness, swelling, drainage, fevers, or chills.  Procedure:  Removal of left fifth toenail with phenol matricectomy of the nailbed. Risks, benefits, alternatives explained to patient. Consent obtained. Time out conducted. Noted no overlying induration or erythema at site of injection. Toe cleaned with alcohol, then a total of 5cc lidocaine 2% infiltrated at adjacent webspaces at the location of the bifurcation of the common digital nerve to proper digital nerves.  Some lidocaine also infiltrated at hyponychium and under nail bed.  Adequate anesthesia ensured. Toe prepped and draped in a sterile fashion. Nail elevator used to separate nail plate from nail bed. Hemostat then used to separate nail fragment from surrounding structures. Nail bed and matrix treated. Minor bleeding controlled with pressure and phenol. Antibiotic ointment applied. Toe dressed. Advised to return if increased redness, swelling, drainage, fevers, or chills.  Impression and Recommendations:    Onychodystrophy Removal of the left second and fifth toenails with phenol matricectomy. Return in 2 weeks for a wound check. Hydrocodone for postoperative pain.  Right foot pain Was running at the beach and told to pop over the dorsum of his right foot, pain is specifically over the second metatarsal neck. X-rays to evaluate for  fracture.  ___________________________________________ Ihor Austin. [name], M.D., ABFM., CAQSM. Primary Care and Sports Medicine [facility]  Adjunct Instructor of Family Medicine  [employer]

## 2018-03-07 NOTE — Assessment & Plan Note (Signed)
Was running at the beach and told to pop over the dorsum of his right foot, pain is specifically over the second metatarsal neck. X-rays to evaluate for fracture.

## 2018-03-14 ENCOUNTER — Ambulatory Visit: Payer: Self-pay | Admitting: Sports Medicine

## 2018-03-16 ENCOUNTER — Ambulatory Visit (INDEPENDENT_AMBULATORY_CARE_PROVIDER_SITE_OTHER): Payer: Self-pay | Admitting: Sports Medicine

## 2018-03-16 DIAGNOSIS — F419 Anxiety disorder, unspecified: Secondary | ICD-10-CM

## 2018-03-16 DIAGNOSIS — F32A Depression, unspecified: Secondary | ICD-10-CM

## 2018-03-16 DIAGNOSIS — L600 Ingrowing nail: Secondary | ICD-10-CM

## 2018-03-16 DIAGNOSIS — M79671 Pain in right foot: Secondary | ICD-10-CM

## 2018-03-16 DIAGNOSIS — L603 Nail dystrophy: Secondary | ICD-10-CM

## 2018-03-16 DIAGNOSIS — F329 Major depressive disorder, single episode, unspecified: Secondary | ICD-10-CM

## 2018-03-16 MED ORDER — HYDROCODONE-ACETAMINOPHEN 10-325 MG PO TABS: 1.0000 | ORAL_TABLET | Freq: Three times a day (TID) | ORAL | 0 refills | Status: DC | PRN

## 2018-03-16 MED ORDER — ALPRAZOLAM 0.25 MG PO TABS: 0.2500 mg | ORAL_TABLET | Freq: Every day | ORAL | 3 refills | Status: DC | PRN

## 2018-03-16 NOTE — Assessment & Plan Note (Signed)
Well-controlled on just a touch of alprazolam, last refill was about 3 months ago, adding an additional refill.

## 2018-03-16 NOTE — Progress Notes (Signed)
Subjective:    CC: Follow-up several issues  HPI: Left foot: 1 week post removal of fifth and second toenails with phenol matricectomy, doing well, has a bit of pain and needs a refill on his pain medication.  Right foot pain: On and off for several months is a pain over the dorsal midfoot, occasionally he will wear his boot and it would get better.  X-rays in the past were negative.  Generalized anxiety: Doing well with an occasional alprazolam, needs a refill.  No suicidal or homicidal ideation.  I reviewed the past medical history, family history, social history, surgical history, and allergies today and no changes were needed.  Please see the problem list section below in epic for further details.  Past Medical History: No past medical history on file. Past Surgical History: Past Surgical History:  Procedure Laterality Date  . HERNIA REPAIR     Social History: Social History   Socioeconomic History  . Marital status: Legally Separated    Spouse name: Not on file  . Number of children: Not on file  . Years of education: Not on file  . Highest education level: Not on file  Occupational History  . Not on file  Social Needs  . Financial resource strain: Not on file  . Food insecurity:    Worry: Not on file    Inability: Not on file  . Transportation needs:    Medical: Not on file    Non-medical: Not on file  Tobacco Use  . Smoking status: Current Every Day Smoker    Packs/day: 1.00    Years: 25.00    Pack years: 25.00  . Smokeless tobacco: Never Used  Substance and Sexual Activity  . Alcohol use: Yes  . Drug use: No  . Sexual activity: Not on file  Lifestyle  . Physical activity:    Days per week: Not on file    Minutes per session: Not on file  . Stress: Not on file  Relationships  . Social connections:    Talks on phone: Not on file    Gets together: Not on file    Attends religious service: Not on file    Active member of club or organization: Not on  file    Attends meetings of clubs or organizations: Not on file    Relationship status: Not on file  Other Topics Concern  . Not on file  Social History Narrative  . Not on file   Family History: Family History  Problem Relation Age of Onset  . Cancer Father        lung  . Heart failure Father   . Diabetes Father   . Cancer Mother        lung  . Heart failure Mother   . Diabetes Mother    Allergies: No Known Allergies Medications: See med rec.  Review of Systems: No fevers, chills, night sweats, weight loss, chest pain, or shortness of breath.   Objective:    General: Well Developed, well nourished, and in no acute distress.  Neuro: Alert and oriented x3, extra-ocular muscles intact, sensation grossly intact.  HEENT: Normocephalic, atraumatic, pupils equal round reactive to light, neck supple, no masses, no lymphadenopathy, thyroid nonpalpable.  Skin: Warm and dry, no rashes. Cardiac: Regular rate and rhythm, no murmurs rubs or gallops, no lower extremity edema.  Respiratory: Clear to auscultation bilaterally. Not using accessory muscles, speaking in full sentences. Left foot: Nailbed appears somewhat erythematous as expected, no signs of  bacterial infection. Right foot: Tender to palpation dorsally over the fourth metatarsal shaft.  No tenderness at the MTP or the TMT.  Impression and Recommendations:    Onychodystrophy Doing well after removal of the left second and fifth toenails with phenol matricectomy, refilling pain medication.  Right foot pain Likely a stress fracture of the fourth metatarsal shaft. X-rays were negative. Boot for 1 to 3 months, it is up to him. Self-pay so we are avoiding MRI at this point.  Anxiety and depression Well-controlled on just a touch of alprazolam, last refill was about 3 months ago, adding an additional refill.  ___________________________________________ Ihor Austin. [name], M.D., ABFM., CAQSM. Primary Care and Sports  Medicine [facility]  Adjunct Instructor of Family Medicine  [employer]

## 2018-03-16 NOTE — Assessment & Plan Note (Signed)
Doing well after removal of the left second and fifth toenails with phenol matricectomy, refilling pain medication.

## 2018-03-16 NOTE — Assessment & Plan Note (Signed)
Likely a stress fracture of the fourth metatarsal shaft. X-rays were negative. Boot for 1 to 3 months, it is up to him. Self-pay so we are avoiding MRI at this point.

## 2018-06-12 ENCOUNTER — Other Ambulatory Visit: Payer: Self-pay | Admitting: Sports Medicine

## 2018-06-12 DIAGNOSIS — F419 Anxiety disorder, unspecified: Principal | ICD-10-CM

## 2018-06-12 DIAGNOSIS — F32A Depression, unspecified: Secondary | ICD-10-CM

## 2018-06-12 DIAGNOSIS — F329 Major depressive disorder, single episode, unspecified: Secondary | ICD-10-CM

## 2018-06-12 MED ORDER — ALPRAZOLAM 0.5 MG PO TABS: 0.2500 mg | ORAL_TABLET | Freq: Every day | ORAL | 0 refills | Status: DC | PRN

## 2018-06-18 ENCOUNTER — Telehealth: Payer: Self-pay | Admitting: *Deleted

## 2018-06-18 DIAGNOSIS — F329 Major depressive disorder, single episode, unspecified: Secondary | ICD-10-CM

## 2018-06-18 DIAGNOSIS — F32A Depression, unspecified: Secondary | ICD-10-CM

## 2018-06-18 DIAGNOSIS — F419 Anxiety disorder, unspecified: Principal | ICD-10-CM

## 2018-06-18 MED ORDER — ALPRAZOLAM 0.5 MG PO TABS: 0.5000 mg | ORAL_TABLET | Freq: Every day | ORAL | 0 refills | Status: DC | PRN

## 2018-06-18 NOTE — Telephone Encounter (Signed)
Done

## 2018-06-18 NOTE — Telephone Encounter (Signed)
Pt.notified

## 2018-06-18 NOTE — Telephone Encounter (Signed)
Pt left vm wanting to know if you would increase his Xanax to .5mg  from .25mg .  Pharmacy on file is correct.

## 2018-09-03 ENCOUNTER — Other Ambulatory Visit: Payer: Self-pay | Admitting: Sports Medicine

## 2018-10-12 ENCOUNTER — Other Ambulatory Visit (INDEPENDENT_AMBULATORY_CARE_PROVIDER_SITE_OTHER): Payer: Self-pay | Admitting: *Deleted

## 2018-10-12 ENCOUNTER — Other Ambulatory Visit: Payer: Self-pay | Admitting: *Deleted

## 2018-10-12 DIAGNOSIS — F329 Major depressive disorder, single episode, unspecified: Secondary | ICD-10-CM

## 2018-10-12 DIAGNOSIS — F419 Anxiety disorder, unspecified: Secondary | ICD-10-CM

## 2018-10-12 MED ORDER — ALPRAZOLAM 0.5 MG PO TABS: 0.5000 mg | ORAL_TABLET | Freq: Every day | ORAL | 0 refills | Status: DC | PRN

## 2018-10-12 NOTE — Telephone Encounter (Signed)
Pt called today needing a refill on his Xanax.  He spoke to me about some of his stressors right now affecting his mood such as, being furloughed from work & his wife of 25 years just left him and their son.  I transferred him to scheduling so you could do a televisit with him on Monday.

## 2018-10-12 NOTE — Assessment & Plan Note (Signed)
Historically controlled with a touch of alprazolam, significant life changes including being furloughed and marriage separation, he does need a bit more alprazolam, he is also going to do an ED visit next week, and we can discuss a controller medication and behavioral therapy.

## 2018-10-15 ENCOUNTER — Ambulatory Visit (INDEPENDENT_AMBULATORY_CARE_PROVIDER_SITE_OTHER): Payer: Self-pay | Admitting: Sports Medicine

## 2018-10-15 ENCOUNTER — Other Ambulatory Visit: Payer: Self-pay

## 2018-10-15 DIAGNOSIS — F32A Depression, unspecified: Secondary | ICD-10-CM

## 2018-10-15 DIAGNOSIS — F419 Anxiety disorder, unspecified: Secondary | ICD-10-CM

## 2018-10-15 DIAGNOSIS — F329 Major depressive disorder, single episode, unspecified: Secondary | ICD-10-CM

## 2018-10-15 MED ORDER — ALPRAZOLAM 0.5 MG PO TABS: 0.5000 mg | ORAL_TABLET | Freq: Two times a day (BID) | ORAL | 0 refills | Status: DC | PRN

## 2018-10-15 NOTE — Assessment & Plan Note (Signed)
Lots of life changes right now, he has been furloughed, going through a marriage separation, he will need about 60 alprazolam per month. I did advise him that we would be down tapering at the end of the month to 30 pills/month, I also recommended using a controller SSRI, he declines for now. Declines behavioral therapy. No suicidal or homicidal ideation.

## 2018-10-15 NOTE — Progress Notes (Signed)
Virtual Visit via Telephone   I connected with  Leonard Gilbert  on 10/15/18 by telephone and verified that I am speaking with the correct person using two identifiers.   I discussed the limitations, risks, security and privacy concerns of performing an evaluation and management service by telephone, including the higher likelihood of inaccurate diagnosis and treatment, and the availability of in person appointments.  We also discussed the likely need of an additional face to face encounter for complete and high quality delivery of care.  I also discussed with the patient that there may be a patient responsible charge related to this service. The patient expressed understanding and wishes to proceed.  Subjective:    CC: Discuss mood disorder  HPI: Leonard Gilbert is having a lot of changes in his life right now, see below in the assessment and plan for further details.  I reviewed the past medical history, family history, social history, surgical history, and allergies today and no changes were needed.  Please see the problem list section below in epic for further details.  Past Medical History: No past medical history on file. Past Surgical History: Past Surgical History:  Procedure Laterality Date  . HERNIA REPAIR     Social History: Social History   Socioeconomic History  . Marital status: Legally Separated    Spouse name: Not on file  . Number of children: Not on file  . Years of education: Not on file  . Highest education level: Not on file  Occupational History  . Not on file  Social Needs  . Financial resource strain: Not on file  . Food insecurity:    Worry: Not on file    Inability: Not on file  . Transportation needs:    Medical: Not on file    Non-medical: Not on file  Tobacco Use  . Smoking status: Current Every Day Smoker    Packs/day: 1.00    Years: 25.00    Pack years: 25.00  . Smokeless tobacco: Never Used  Substance and Sexual Activity  . Alcohol use: Yes  .  Drug use: No  . Sexual activity: Not on file  Lifestyle  . Physical activity:    Days per week: Not on file    Minutes per session: Not on file  . Stress: Not on file  Relationships  . Social connections:    Talks on phone: Not on file    Gets together: Not on file    Attends religious service: Not on file    Active member of club or organization: Not on file    Attends meetings of clubs or organizations: Not on file    Relationship status: Not on file  Other Topics Concern  . Not on file  Social History Narrative  . Not on file   Family History: Family History  Problem Relation Age of Onset  . Cancer Father        lung  . Heart failure Father   . Diabetes Father   . Cancer Mother        lung  . Heart failure Mother   . Diabetes Mother    Allergies: No Known Allergies Medications: See med rec.  Review of Systems: No fevers, chills, night sweats, weight loss, chest pain, or shortness of breath.   Objective:    General: Speaking full sentences, no audible heavy breathing.  Sounds alert and appropriately interactive.  No other physical exam performed due to the non-face to face nature of this  visit.  Impression and Recommendations:    Anxiety and depression Lots of life changes right now, he has been furloughed, going through a marriage separation, he will need about 60 alprazolam per month. I did advise him that we would be down tapering at the end of the month to 30 pills/month, I also recommended using a controller SSRI, he declines for now. Declines behavioral therapy. No suicidal or homicidal ideation.   I discussed the above assessment and treatment plan with the patient. The patient was provided an opportunity to ask questions and all were answered. The patient agreed with the plan and demonstrated an understanding of the instructions.   The patient was advised to call back or seek an in-person evaluation if the symptoms worsen or if the condition fails to  improve as anticipated.   I provided 21 minutes of non-face-to-face time during this encounter, less than 50% of this was time needed to gather information, review chart, records, and complete documentation.   ___________________________________________ Ihor Austin. [name], M.D., ABFM., CAQSM. Primary Care and Sports Medicine [facility]  Adjunct Professor of Family Medicine  [employer]

## 2018-11-15 ENCOUNTER — Other Ambulatory Visit: Payer: Self-pay

## 2018-11-15 ENCOUNTER — Ambulatory Visit (INDEPENDENT_AMBULATORY_CARE_PROVIDER_SITE_OTHER): Payer: Self-pay | Admitting: Sports Medicine

## 2018-11-15 ENCOUNTER — Ambulatory Visit: Payer: Self-pay | Admitting: Sports Medicine

## 2018-11-15 DIAGNOSIS — R030 Elevated blood-pressure reading, without diagnosis of hypertension: Secondary | ICD-10-CM

## 2018-11-15 DIAGNOSIS — F419 Anxiety disorder, unspecified: Secondary | ICD-10-CM

## 2018-11-15 DIAGNOSIS — F329 Major depressive disorder, single episode, unspecified: Secondary | ICD-10-CM

## 2018-11-15 DIAGNOSIS — Z0000 Encounter for general adult medical examination without abnormal findings: Secondary | ICD-10-CM

## 2018-11-15 DIAGNOSIS — F32A Depression, unspecified: Secondary | ICD-10-CM

## 2018-11-15 NOTE — Assessment & Plan Note (Signed)
Has had a few elevated blood pressure readings, they have been intermittent, occasionally normal in the office. We are going to check routine labs, and if still elevated at the follow-up visit we will treat him and diagnosed him with hypertension.

## 2018-11-15 NOTE — Assessment & Plan Note (Signed)
Leonard Gilbert is doing okay, he is going through separation, he has moved out of the house. Found a new house, he is going back to work again next week.  Things overall seem to be improving.   He is using his alprazolam sparingly, and does not need a refill yet. The plan was to refill with 30 pills when he needs it.

## 2018-11-15 NOTE — Assessment & Plan Note (Signed)
Adding routine labs. I would like to see him back in 6 months and we can do an actual physical then.

## 2018-11-15 NOTE — Progress Notes (Signed)
Unable to check BP/Pulse. Wife of 25 yrs left him. Having to put pet rat down. Very tearful. PHQ/GAD completed.

## 2018-11-15 NOTE — Progress Notes (Signed)
Virtual Visit via Telephone   I connected with  Leonard Gilbert  on 11/15/18 by telephone/telehealth and verified that I am speaking with the correct person using two identifiers.   I discussed the limitations, risks, security and privacy concerns of performing an evaluation and management service by telephone, including the higher likelihood of inaccurate diagnosis and treatment, and the availability of in person appointments.  We also discussed the likely need of an additional face to face encounter for complete and high quality delivery of care.  I also discussed with the patient that there may be a patient responsible charge related to this service. The patient expressed understanding and wishes to proceed.  Provider location is either at home or medical facility. Patient location is at their home, different from provider location. People involved in care of the patient during this telehealth encounter were myself, my nurse/medical assistant, and my front office/scheduling team member.  Subjective:    CC: Follow-up  HPI: Leonard Gilbert is doing better, see below in the assessment and plan for further details.  I reviewed the past medical history, family history, social history, surgical history, and allergies today and no changes were needed.  Please see the problem list section below in epic for further details.  Past Medical History: No past medical history on file. Past Surgical History: Past Surgical History:  Procedure Laterality Date  . HERNIA REPAIR     Social History: Social History   Socioeconomic History  . Marital status: Legally Separated    Spouse name: Not on file  . Number of children: Not on file  . Years of education: Not on file  . Highest education level: Not on file  Occupational History  . Not on file  Social Needs  . Financial resource strain: Not on file  . Food insecurity:    Worry: Not on file    Inability: Not on file  . Transportation needs:    Medical:  Not on file    Non-medical: Not on file  Tobacco Use  . Smoking status: Current Every Day Smoker    Packs/day: 1.00    Years: 25.00    Pack years: 25.00  . Smokeless tobacco: Never Used  Substance and Sexual Activity  . Alcohol use: Yes  . Drug use: No  . Sexual activity: Not on file  Lifestyle  . Physical activity:    Days per week: Not on file    Minutes per session: Not on file  . Stress: Not on file  Relationships  . Social connections:    Talks on phone: Not on file    Gets together: Not on file    Attends religious service: Not on file    Active member of club or organization: Not on file    Attends meetings of clubs or organizations: Not on file    Relationship status: Not on file  Other Topics Concern  . Not on file  Social History Narrative  . Not on file   Family History: Family History  Problem Relation Age of Onset  . Cancer Father        lung  . Heart failure Father   . Diabetes Father   . Cancer Mother        lung  . Heart failure Mother   . Diabetes Mother    Allergies: No Known Allergies Medications: See med rec.  Review of Systems: No fevers, chills, night sweats, weight loss, chest pain, or shortness of breath.   Objective:  General: Speaking full sentences, no audible heavy breathing.  Sounds alert and appropriately interactive.  No other physical exam performed due to the non-face to face nature of this visit.  Impression and Recommendations:    Anxiety and depression Leonard Gilbert is doing okay, he is going through separation, he has moved out of the house. Found a new house, he is going back to work again next week.  Things overall seem to be improving.   He is using his alprazolam sparingly, and does not need a refill yet. The plan was to refill with 30 pills when he needs it.  Annual physical exam Adding routine labs. I would like to see him back in 6 months and we can do an actual physical then.  Elevated blood pressure reading Has  had a few elevated blood pressure readings, they have been intermittent, occasionally normal in the office. We are going to check routine labs, and if still elevated at the follow-up visit we will treat him and diagnosed him with hypertension.   I discussed the above assessment and treatment plan with the patient. The patient was provided an opportunity to ask questions and all were answered. The patient agreed with the plan and demonstrated an understanding of the instructions.   The patient was advised to call back or seek an in-person evaluation if the symptoms worsen or if the condition fails to improve as anticipated.   I provided 25 minutes of non-face-to-face time during this encounter, 15 minutes of additional time was needed to gather information, review chart, records, communicate/coordinate with staff remotely, and complete documentation.   ___________________________________________ Ihor Austin. [name], M.D., ABFM., CAQSM. Primary Care and Sports Medicine [facility]  Adjunct Professor of Family Medicine  [employer]

## 2018-11-23 ENCOUNTER — Other Ambulatory Visit: Payer: Self-pay | Admitting: Sports Medicine

## 2018-11-23 DIAGNOSIS — F419 Anxiety disorder, unspecified: Secondary | ICD-10-CM

## 2018-11-23 DIAGNOSIS — F329 Major depressive disorder, single episode, unspecified: Secondary | ICD-10-CM

## 2018-11-23 MED ORDER — ALPRAZOLAM 0.5 MG PO TABS: 0.5000 mg | ORAL_TABLET | Freq: Two times a day (BID) | ORAL | 0 refills | Status: AC | PRN

## 2018-11-23 NOTE — Telephone Encounter (Signed)
Patient called in and states that he is needing a refill of his Xanax. It has been pended to send to [facility] in [place]. Please advise.

## 2018-11-29 ENCOUNTER — Other Ambulatory Visit: Payer: Self-pay | Admitting: Sports Medicine

## 2019-11-29 IMAGING — DX DG FOOT COMPLETE 3+V*R*
3 series · 3 of 3 positions shown · non-contrast
Comparison: None.

CLINICAL DATA: 42-year-old male with pain at the 2nd metatarsal
[place]

EXAM:
RIGHT FOOT COMPLETE - 3+ VIEW

[foot ap]
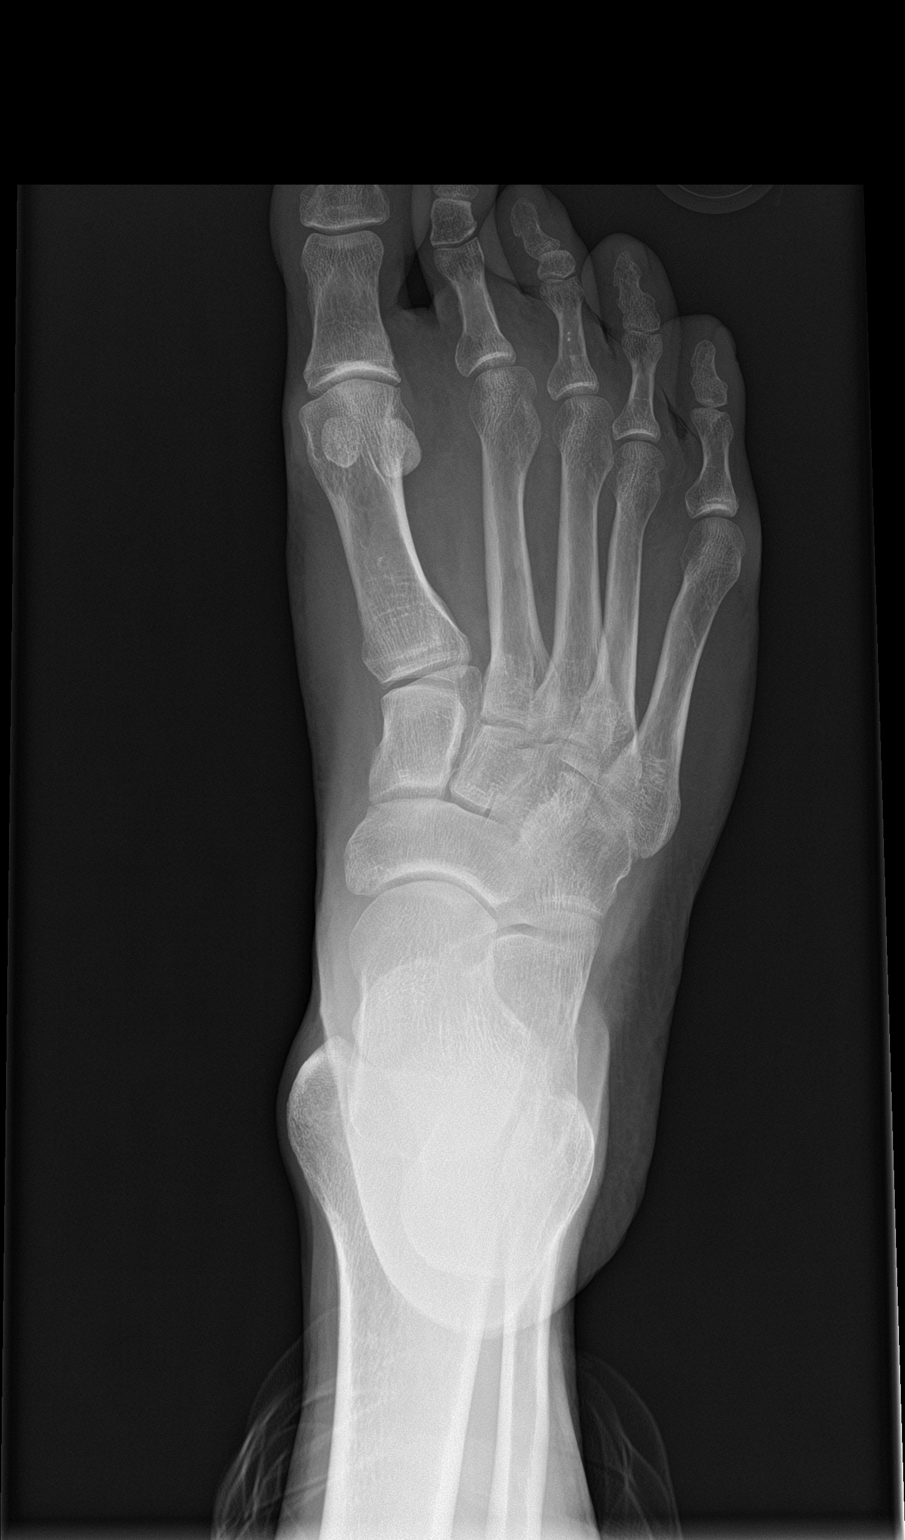

[foot obl]
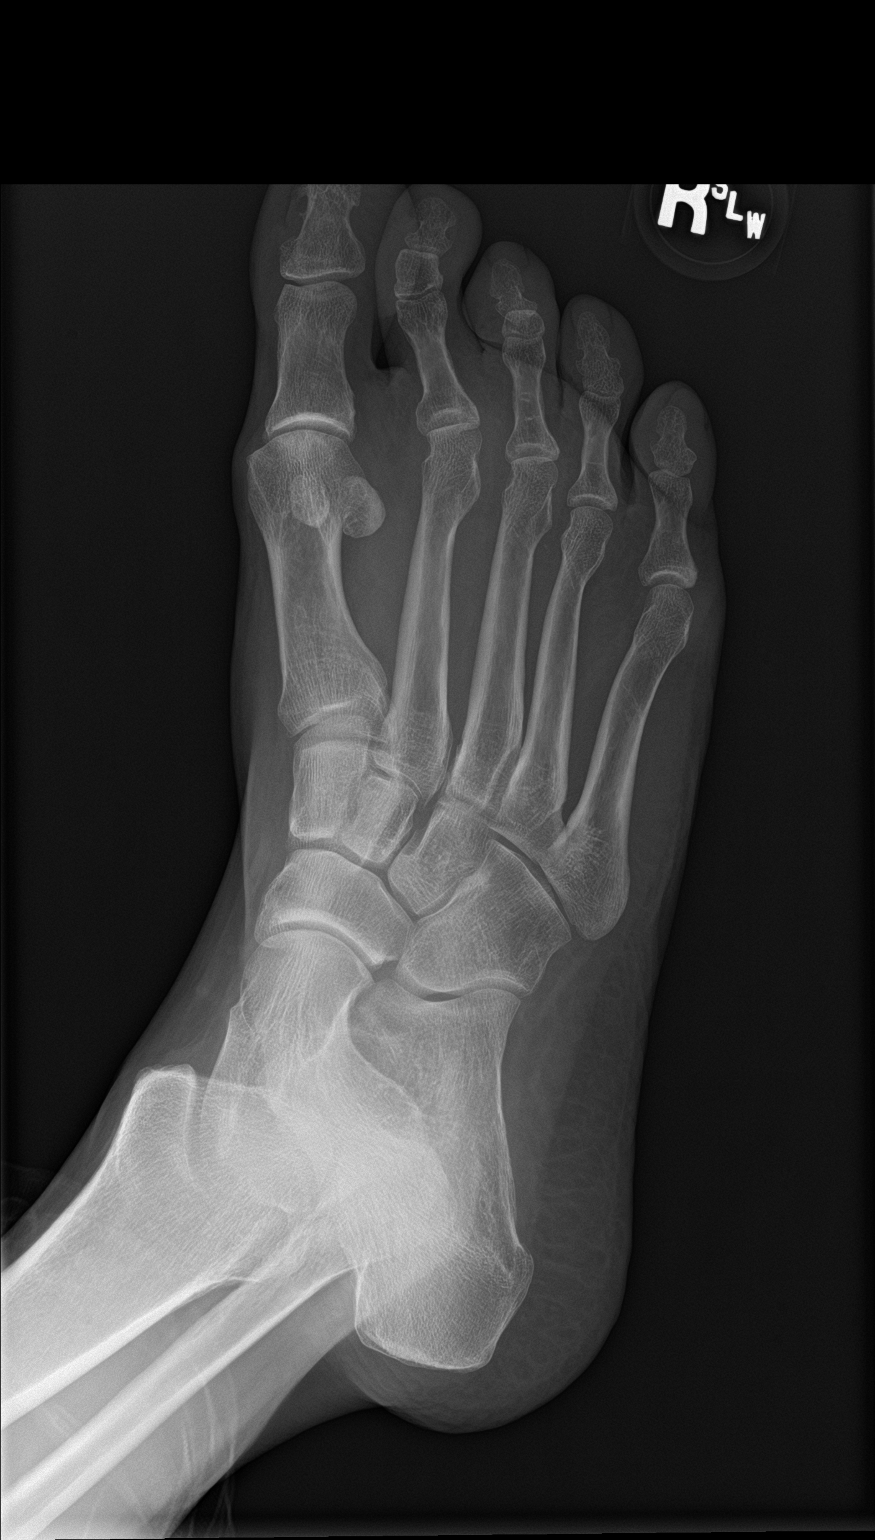

[foot lat]
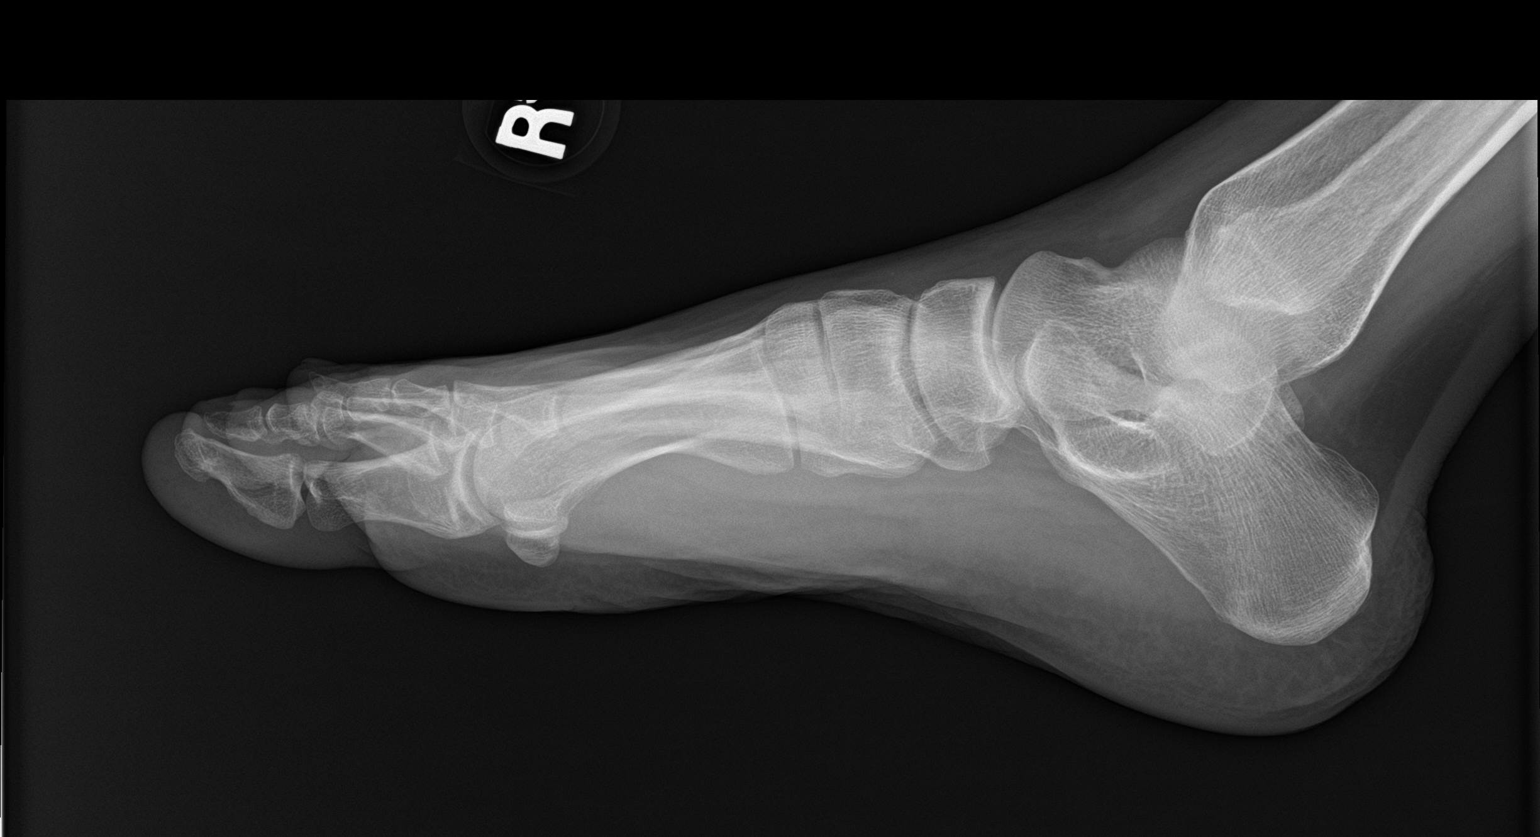

[3 of 3 positions shown; findings below may reference images not displayed]

FINDINGS: Bone mineralization is within normal limits. There is no evidence of
fracture or dislocation. Normal joint spaces aside from mild to
moderate 1st MTP joint space loss with mild subchondral sclerosis.
The 2nd metatarsal head, neck, and 2nd MTP joint appear normal.
Other joint spaces are normal. No discrete soft tissue injury.
IMPRESSION: No acute osseous abnormality identified in the right foot. Mild 1st
MTP joint osteoarthritis.

## 2024-01-16 NOTE — Progress Notes (Signed)
 He did   Patient Name:  Leonard Gilbert  (MRN: [id number])    DOB: 1975-12-16     Primary Care Provider: No primary care provider on file.   Primary Cardiologist (will be): [facility]: MICAEL Gustavo Kerns, M.D.    [facility]  Urgent Consultation 01/16/2024  Patient Referred by: Garnette Pride [facility]    Assessment/Plan  [facility]: MICAEL Gustavo Kerns, M.D. is Office Call MD today and will be assigned as this patient's primary cardiologist.  1. Chest pain Recent ED visit on 01/04/2024 ECG and troponin was unremarkable and he was ruled out for ACS however noted to have LVH  Chest discomfort characterized as sharp in nature and worsened with positional changes Felt most consistent with pericarditis and prescribed colchicine Patient reports improvement in symptoms since the ED visit Recommended start NSAID therapy ibuprofen 600 to 800 mg 3 times daily for 1 to 2 weeks then taper Continue colchicine 0.6 mg daily (decrease secondary to diarrhea) for 3 months Avoid strenuous activity Return for close follow-up to reassess symptoms Echocardiogram ordered to evaluate for structural or functional cardiac abnormalities in the context of chest pain and LVH strain on ECG.  2. Pericardial effusion (*)  Small pericardial effusion noted on CTA Will order echocardiogram to assess heart function and reassess effusion Also assess for any wall motion abnormality that would warrant ischemic evaluation  3. Hypertension Well-controlled on current regimen Blood pressure goal discussed in office today, encouraged daily monitoring  4. Tobacco abuse  Counselled for 5 minutes on smoking cessation to decrease the risk for heart disease, lung disease, stroke, cancer, and premature death   5. LVH  Noted on ECG in office today  Echocardiogram ordered to assess heart function as mentioned above  Recommend continued monitoring of bp   Follow up in about 3 months (around 04/17/2024) for Follow up with Dr. Kerns .    Subjective  Chief Complaint Chief Complaint  Patient presents with   Acute Visit    7 days post ed follow up    Chest Pain    Chest tightness      History of Present Illness Leonard Gilbert is a 48 y.o. male who is referred to [facility] (Cardiology) for evaluation and management of precordial chest pain following a recent ER visit for the same.  Chest pain was described as sharp in nature and worse with positional changes.  ECG and troponin was unremarkable and he was ruled out for ACS.  CTA was negative for pulmonary embolism however did demonstrate small pericardial effusion.  His symptoms seemed most consistent with pericarditis and he was started on colchicine.  Also given oxycodone  for the pain.  Discharged with recommendations for [facility] cardiology follow-up.  His medical problems include hypertension, tobacco abuse.  In follow-up he reports that his chest pain has been improving.  He states that he has not needed the oxycodone .  Does report some diarrhea secondary to colchicine.  Denies any exertional chest discomfort or dyspnea.  Denies shortness of breath, orthopnea, lower extreme edema.  Denies any dizziness, lightheadedness, presyncope, syncope.  Blood pressure well-controlled previously on antihypertensive medications however reports he has not needed this for quite some time.  Denies any recent viral illness.  Allergies No Known Allergies   Medications Current Outpatient Medications  Medication Sig Dispense Refill   atorvastatin (LIPITOR) 40 mg tablet Take one tablet (40 mg dose) by mouth daily. (Patient not taking: Reported on 01/04/2024)     carvedilol (COREG) 12.5  mg tablet Take 1 tablet (12.5 mg total) by mouth 2 (two) times daily with meals. (Patient not taking: Reported on 01/04/2024)     colchicine 0.6 mg tablet Take one tablet (0.6 mg dose) by mouth 2 (two) times daily for 30 days. 60 tablet 0   hydrALAzine HCl (APRESOLINE) 50 mg tablet Take 1 tablet (50 mg  total) by mouth every 8 (eight) hours. (Patient not taking: Reported on 01/04/2024)     ibuprofen (ADVIL,MOTRIN) 800 mg tablet Take one tablet (800 mg dose) by mouth every 8 (eight) hours.     No current facility-administered medications for this visit.     Past Medical History Past Medical History:  Diagnosis Date   Anxiety    GERD (gastroesophageal reflux disease)      Surgical History History reviewed. No pertinent surgical history.   Family History He family history is not on file.   Social History  He  reports that he has been smoking cigarettes. He has never used smokeless tobacco. He reports current alcohol use. He reports that he does not currently use drugs.    Objective  Physical Exam BP 120/64   Pulse 71   Ht 5' 11 (1.803 m)   Wt 152 lb (68.9 kg)   BMI 21.20 kg/m   General Appearance:  Alert, cooperative, no acute distress  Neck: No lymphadenopathy or JVD  Lungs:   Good respiratory effort, Lungs are clear to auscultation   Heart:  regular rhythm, normal rate with normal s1, s2, no murmur, no rub or gallop   Abdomen:   Normoactive bowel sounds, soft, non-tender, no distention  Extremities: No cyanosis or edema   Pulses: 2+ and symmetric  Skin: Intact, no rashes or lesions  Neurologic: Grossly non-focal, AOx3    Data  ECG 01/16/2024:  Results for orders placed or performed in visit on 01/16/24 (from the past 12 hours)  1. ECG 12 lead (93000)   Narrative   Diagnosis Class Abnormal Acquisition Device MAC7 Ventricular Rate 71 Atrial Rate 71 P-R Interval 146 QRS Duration 94 Q-T Interval 378 QTC Calculation(Bazett) 410 Calculated P Axis 60 Calculated R Axis 85 Calculated T Axis 31  Diagnosis Normal sinus rhythm Biatrial enlargement Minimal voltage criteria for LVH, may be normal variant ( Sokolow-Lyon ) Nonspecific T wave abnormality Abnormal ECG When compared with ECG of 04-Jan-2024 11:28, No significant change was found     No specialty  comments available.   Labs:  Pertinent Labs (& date obtained) Lab Results  Component Value Date   Potassium 3.7 01/04/2024   Creatinine 0.83 01/04/2024   WBC 14.3 (H) 01/04/2024   HGB 16.5 01/04/2024   HCT 49.3 01/04/2024   Plt Ct 267 01/04/2024   ALT 122 (H) 01/04/2024   AST 66 (H) 01/04/2024       Follow up in about 3 months (around 04/17/2024) for Follow up with Dr. Ezzard . An After Visit Summary (AVS) was printed and given to the patient.   Zelda Latino PA-C [facility] (Cardiology) 01/16/2024, 7:01 PM
